# Patient Record
Sex: Male | Born: 1945 | Race: White | Hispanic: No | Marital: Married | Smoking: Former smoker
Health system: Southern US, Community
[De-identification: ages and names within clinical notes are randomized; demographics above are authoritative.]

## PROBLEM LIST (undated history)

## (undated) DIAGNOSIS — K759 Inflammatory liver disease, unspecified: Secondary | ICD-10-CM

## (undated) DIAGNOSIS — I10 Essential (primary) hypertension: Secondary | ICD-10-CM

## (undated) DIAGNOSIS — Z8711 Personal history of peptic ulcer disease: Secondary | ICD-10-CM

## (undated) DIAGNOSIS — I82409 Acute embolism and thrombosis of unspecified deep veins of unspecified lower extremity: Secondary | ICD-10-CM

## (undated) DIAGNOSIS — J449 Chronic obstructive pulmonary disease, unspecified: Secondary | ICD-10-CM

## (undated) DIAGNOSIS — I2699 Other pulmonary embolism without acute cor pulmonale: Secondary | ICD-10-CM

## (undated) DIAGNOSIS — J42 Unspecified chronic bronchitis: Secondary | ICD-10-CM

## (undated) DIAGNOSIS — R7611 Nonspecific reaction to tuberculin skin test without active tuberculosis: Secondary | ICD-10-CM

## (undated) DIAGNOSIS — D649 Anemia, unspecified: Secondary | ICD-10-CM

## (undated) DIAGNOSIS — G8929 Other chronic pain: Secondary | ICD-10-CM

## (undated) DIAGNOSIS — Z9289 Personal history of other medical treatment: Secondary | ICD-10-CM

## (undated) DIAGNOSIS — N281 Cyst of kidney, acquired: Secondary | ICD-10-CM

## (undated) DIAGNOSIS — M1990 Unspecified osteoarthritis, unspecified site: Secondary | ICD-10-CM

## (undated) DIAGNOSIS — Z9981 Dependence on supplemental oxygen: Secondary | ICD-10-CM

## (undated) DIAGNOSIS — M479 Spondylosis, unspecified: Secondary | ICD-10-CM

## (undated) DIAGNOSIS — M549 Dorsalgia, unspecified: Secondary | ICD-10-CM

## (undated) DIAGNOSIS — J45909 Unspecified asthma, uncomplicated: Secondary | ICD-10-CM

## (undated) DIAGNOSIS — Z8719 Personal history of other diseases of the digestive system: Secondary | ICD-10-CM

## (undated) HISTORY — PX: INGUINAL HERNIA REPAIR: SUR1180

## (undated) HISTORY — PX: KNEE CARTILAGE SURGERY: SHX688

## (undated) HISTORY — PX: HYDROCELE EXCISION: SHX482

## (undated) HISTORY — PX: NODE DISSECTION: SHX5269

## (undated) HISTORY — PX: APPENDECTOMY: SHX54

## (undated) HISTORY — PX: HEMORRHOID SURGERY: SHX153

---
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
## 2013-05-09 HISTORY — PX: SHOULDER ARTHROSCOPY W/ ROTATOR CUFF REPAIR: SHX2400

## 2014-06-17 ENCOUNTER — Inpatient Hospital Stay (HOSPITAL_COMMUNITY)
Admission: AD | Admit: 2014-06-17 | Discharge: 2014-06-22 | DRG: 176 | Disposition: A | Discharge status: Home / Self Care | Payer: Non-veteran care | Source: Other Acute Inpatient Hospital | Attending: Family Medicine | Admitting: Family Medicine

## 2014-06-17 DIAGNOSIS — I10 Essential (primary) hypertension: Secondary | ICD-10-CM | POA: Diagnosis present

## 2014-06-17 DIAGNOSIS — Z9981 Dependence on supplemental oxygen: Secondary | ICD-10-CM | POA: Diagnosis not present

## 2014-06-17 DIAGNOSIS — F1010 Alcohol abuse, uncomplicated: Secondary | ICD-10-CM | POA: Diagnosis present

## 2014-06-17 DIAGNOSIS — K219 Gastro-esophageal reflux disease without esophagitis: Secondary | ICD-10-CM | POA: Diagnosis present

## 2014-06-17 DIAGNOSIS — J42 Unspecified chronic bronchitis: Secondary | ICD-10-CM | POA: Diagnosis not present

## 2014-06-17 DIAGNOSIS — I2699 Other pulmonary embolism without acute cor pulmonale: Secondary | ICD-10-CM | POA: Diagnosis present

## 2014-06-17 DIAGNOSIS — I714 Abdominal aortic aneurysm, without rupture: Secondary | ICD-10-CM | POA: Diagnosis present

## 2014-06-17 DIAGNOSIS — N400 Enlarged prostate without lower urinary tract symptoms: Secondary | ICD-10-CM | POA: Diagnosis present

## 2014-06-17 DIAGNOSIS — J449 Chronic obstructive pulmonary disease, unspecified: Secondary | ICD-10-CM | POA: Diagnosis not present

## 2014-06-17 DIAGNOSIS — I82439 Acute embolism and thrombosis of unspecified popliteal vein: Secondary | ICD-10-CM | POA: Diagnosis not present

## 2014-06-17 DIAGNOSIS — Z87891 Personal history of nicotine dependence: Secondary | ICD-10-CM | POA: Diagnosis not present

## 2014-06-17 DIAGNOSIS — I82431 Acute embolism and thrombosis of right popliteal vein: Secondary | ICD-10-CM | POA: Diagnosis present

## 2014-06-17 DIAGNOSIS — R0602 Shortness of breath: Secondary | ICD-10-CM | POA: Diagnosis present

## 2014-06-17 HISTORY — DX: Unspecified chronic bronchitis: J42

## 2014-06-17 HISTORY — DX: Chronic obstructive pulmonary disease, unspecified: J44.9

## 2014-06-17 HISTORY — DX: Cyst of kidney, acquired: N28.1

## 2014-06-17 HISTORY — DX: Acute embolism and thrombosis of unspecified deep veins of unspecified lower extremity: I82.409

## 2014-06-17 HISTORY — DX: Nonspecific reaction to tuberculin skin test without active tuberculosis: R76.11

## 2014-06-17 HISTORY — DX: Other chronic pain: G89.29

## 2014-06-17 HISTORY — DX: Personal history of other diseases of the digestive system: Z87.19

## 2014-06-17 HISTORY — DX: Unspecified asthma, uncomplicated: J45.909

## 2014-06-17 HISTORY — DX: Essential (primary) hypertension: I10

## 2014-06-17 HISTORY — DX: Unspecified osteoarthritis, unspecified site: M19.90

## 2014-06-17 HISTORY — DX: Spondylosis, unspecified: M47.9

## 2014-06-17 HISTORY — DX: Dependence on supplemental oxygen: Z99.81

## 2014-06-17 HISTORY — DX: Anemia, unspecified: D64.9

## 2014-06-17 HISTORY — DX: Dorsalgia, unspecified: M54.9

## 2014-06-17 HISTORY — DX: Personal history of other medical treatment: Z92.89

## 2014-06-17 HISTORY — DX: Inflammatory liver disease, unspecified: K75.9

## 2014-06-17 HISTORY — DX: Personal history of peptic ulcer disease: Z87.11

## 2014-06-17 HISTORY — DX: Other pulmonary embolism without acute cor pulmonale: I26.99

## 2014-06-17 LAB — CBC
HEMATOCRIT: 47.5 % (ref 39.0–52.0)
HEMOGLOBIN: 15.9 g/dL (ref 13.0–17.0)
MCH: 31.7 pg (ref 26.0–34.0)
MCHC: 33.5 g/dL (ref 30.0–36.0)
MCV: 94.6 fL (ref 78.0–100.0)
Platelets: 193 10*3/uL (ref 150–400)
RBC: 5.02 MIL/uL (ref 4.22–5.81)
RDW: 12.3 % (ref 11.5–15.5)
WBC: 5.5 10*3/uL (ref 4.0–10.5)

## 2014-06-17 LAB — PROTIME-INR
INR: 1.23 (ref 0.00–1.49)
Prothrombin Time: 15.6 seconds — ABNORMAL HIGH (ref 11.6–15.2)

## 2014-06-17 LAB — MRSA PCR SCREENING: MRSA by PCR: NEGATIVE

## 2014-06-17 LAB — BRAIN NATRIURETIC PEPTIDE: B Natriuretic Peptide: 52 pg/mL (ref 0.0–100.0)

## 2014-06-17 LAB — GLUCOSE, CAPILLARY
GLUCOSE-CAPILLARY: 152 mg/dL — AB (ref 70–99)
Glucose-Capillary: 169 mg/dL — ABNORMAL HIGH (ref 70–99)

## 2014-06-17 LAB — TROPONIN I

## 2014-06-17 LAB — APTT: aPTT: >200 seconds (ref 24–37)

## 2014-06-17 LAB — LACTIC ACID, PLASMA: LACTIC ACID, VENOUS: 1.1 mmol/L (ref 0.5–2.0)

## 2014-06-17 MED ORDER — LORAZEPAM 2 MG/ML IJ SOLN: 1.0000 mg | INTRAMUSCULAR | Status: DC | PRN

## 2014-06-17 MED ORDER — VITAMIN B-1 100 MG PO TABS
100.0000 mg | ORAL_TABLET | Freq: Every day | ORAL | Status: DC
  Administered 2014-06-17 – 2014-06-22 (×5): 100 mg via ORAL
  Filled 2014-06-17 (×6): qty 1

## 2014-06-17 MED ORDER — DEXMEDETOMIDINE HCL IN NACL 200 MCG/50ML IV SOLN: 0.2000 ug/kg/h | INTRAVENOUS | Status: DC

## 2014-06-17 MED ORDER — SODIUM CHLORIDE 0.9 % IV SOLN
INTRAVENOUS | Status: DC
  Administered 2014-06-17: 20:00:00 via INTRAVENOUS

## 2014-06-17 MED ORDER — ADULT MULTIVITAMIN W/MINERALS CH
1.0000 | ORAL_TABLET | Freq: Every day | ORAL | Status: DC
  Administered 2014-06-17 – 2014-06-22 (×5): 1 via ORAL
  Filled 2014-06-17 (×6): qty 1

## 2014-06-17 MED ORDER — HEPARIN BOLUS VIA INFUSION
4000.0000 [IU] | Freq: Once | INTRAVENOUS | Status: AC
  Administered 2014-06-17: 4000 [IU] via INTRAVENOUS
  Filled 2014-06-17: qty 4000

## 2014-06-17 MED ORDER — IPRATROPIUM-ALBUTEROL 0.5-2.5 (3) MG/3ML IN SOLN
3.0000 mL | RESPIRATORY_TRACT | Status: DC
  Administered 2014-06-17 – 2014-06-18 (×3): 3 mL via RESPIRATORY_TRACT
  Filled 2014-06-17 (×3): qty 3

## 2014-06-17 MED ORDER — HEPARIN (PORCINE) IN NACL 100-0.45 UNIT/ML-% IJ SOLN
1550.0000 [IU]/h | INTRAMUSCULAR | Status: AC
  Administered 2014-06-18: 1300 [IU]/h via INTRAVENOUS
  Administered 2014-06-19 – 2014-06-20 (×2): 1500 [IU]/h via INTRAVENOUS
  Administered 2014-06-21: 1550 [IU]/h via INTRAVENOUS
  Administered 2014-06-21: 1500 [IU]/h via INTRAVENOUS
  Filled 2014-06-17 (×8): qty 250

## 2014-06-17 MED ORDER — FOLIC ACID 1 MG PO TABS
1.0000 mg | ORAL_TABLET | Freq: Every day | ORAL | Status: DC
  Administered 2014-06-17 – 2014-06-22 (×5): 1 mg via ORAL
  Filled 2014-06-17 (×6): qty 1

## 2014-06-17 MED ORDER — CHLORHEXIDINE GLUCONATE 0.12 % MT SOLN
15.0000 mL | Freq: Two times a day (BID) | OROMUCOSAL | Status: DC
  Administered 2014-06-18 – 2014-06-19 (×4): 15 mL via OROMUCOSAL
  Filled 2014-06-17 (×6): qty 15

## 2014-06-17 MED ORDER — CETYLPYRIDINIUM CHLORIDE 0.05 % MT LIQD
7.0000 mL | Freq: Two times a day (BID) | OROMUCOSAL | Status: DC
  Administered 2014-06-19 – 2014-06-20 (×4): 7 mL via OROMUCOSAL

## 2014-06-17 MED ORDER — PANTOPRAZOLE SODIUM 40 MG IV SOLR
40.0000 mg | Freq: Every day | INTRAVENOUS | Status: DC
  Administered 2014-06-17 – 2014-06-19 (×3): 40 mg via INTRAVENOUS
  Filled 2014-06-17 (×3): qty 40

## 2014-06-17 NOTE — H&P (Signed)
PULMONARY / CRITICAL CARE MEDICINE HISTORY AND PHYSICAL EXAMINATION   Name: Brian Clark MRN: [id number] DOB: February 11, 1946    ADMISSION DATE:  06/17/2014  PRIMARY SERVICE: PCCM  CHIEF COMPLAINT:  Chest pain, shortness of breath  BRIEF PATIENT DESCRIPTION: Mr. Brian Clark is a 69 yo male with PMHx of tobacco abuse, COPD on 2L [place] at home, GERD, BPH, and HTN who presented to [facility] hospital last night due to chest pain and shortness of breath. Patient was found to have a R popliteal vein DVT and extensive bilateral PE with moderate-large clot burden. Patient transferred to [facility] on heparin gtt for consideration of EKOS.  SIGNIFICANT EVENTS / STUDIES:  UA 4/8>> normal CXR 4/8>> hyperinflated lungs consistent with emphysema, but no acute cardiopulmonary findings R LE Doppler 4/8>> Positive for DVT in right popliteal vein, early re-cannulization of thrombus noted CT Angio Chest w Contrast 4/8>> bilateral PE involving RLL, RML, LUL. Clot burden moderate to large, increased RV to LV ratio at 1.5 with flattening of intraventricular septum compatible with increased right heart pressures/submassive PE. Positive for acute PE with CT evidence of RH strain at least submassive PE. Centrilobar emphysema-severe. Airway thickening indicative of bronchitis or reactive airway disease.   LINES / TUBES: R PIV x 2 4/8>>  CULTURES: None  ANTIBIOTICS: Ciprofloxacin for previous UTI completed 4/6 Rocephin started at [facility] on 4/8>D/C Azithromcyin started at [facility] on 4/8>>D/C  HISTORY OF PRESENT ILLNESS:  Mr. Brian Clark is a 69 yo male with PMHx of tobacco abuse, COPD on 2L [place] at home, GERD, BPH, and HTN who presented to [facility] hospital last night due to chest pain and shortness of breath. Patient states over the past 2-3 weeks he has felt feverish with increased shortness of breath and a productive cough of clear sputum. He is normally on 2 L of O2 during the day, 4 L with exertion, at 3 L at night. Lately he  has been requiring at least 3 L. Of note patient states he was diagnosed with a UTI with hematuria 2 weeks ago and completed a 2 week course of ciprofloxacin 2 days ago. Last night at 0130, patient awoke from sleep with severe left sided chest pain at a 5/10, but increased to a 7/10. Pain was located on the left side of his sternum with radiation inferiorly to his left diaphragm. Pain was worse with deep inspiration. Nothing seemed to make the pain better but time. Onset of pain was associated with shortness of breath, but he denied diaphoresis, dizziness, lightheadedness, nausea, or vomiting. Of note, patient does state he has had pain in his right lower extremity and ankle edema for the past 2 weeks. He denies any calf tenderness. Patient has a 120 pack year history and admits to decreased mobility at home. He normally sits most of the day. No prior history of heart disease, CAD, DVT or PE. Well's score of 9. Patient presented to [facility] and was found to have a R popliteal vein DVT and extensive bilateral PE with moderate-large clot burden. Patient transferred to [facility] on heparin gtt for consideration of EKOS.  PAST MEDICAL HISTORY : Tobacco abuse 3-4 ppd for 47 years, quit in 2000 HTN BPH COPD on home O2 GERD  PAST SURGICAL HISTORY: Appendectomy Rotator Cuff Surgery in March 2015  Prior to Admission medications   Not on File  Albuterol, amlodipine 2.5 mg QD, ASA 81 mg QD, Finasteride 5 mg daily, Singulair, lisinopril 40 mg daily, omeprazole 20 mg daily, tamsulosin 0.4 mg  QD, spiriva.   No Known Allergies  FAMILY HISTORY:  Mother, Father- CVA  SOCIAL HISTORY: Tobacco abuse 3-4 ppd for 47 years, quit in 2000 Alcohol abuse 8-10 beers a day No illicit drug use Married  REVIEW OF SYSTEMS:   General: Admits to fever and chills, fatigue. Denies change in appetite and diaphoresis.  Respiratory: Admits to SOB, productive cough, DOE, chest tightness, and wheezing.    Cardiovascular: Admits to chest pain, denies palpitations.  Gastrointestinal: Denies nausea, vomiting, abdominal pain, diarrhea, constipation Genitourinary: Admits to hematuria (resolved). Denies dysuria, urgency, frequency, suprapubic pain and flank pain.  Skin: Denies pallor, rash and wounds.  Neurological: Denies dizziness, headaches, weakness, lightheadedness   PHYSICAL EXAMINATION: Filed Vitals:   06/17/14 1830 06/17/14 1840 06/17/14 1845 06/17/14 1900  BP: 139/81  138/83 138/93  Pulse: 114 109 106 105  Resp: 19 19 21 22   Height:  6' (1.829 m)    Weight:  190 lb 0.6 oz (86.2 kg)    SpO2: 94% 96% 95% 94%   General: Vital signs reviewed.  Patient is well-developed and well-nourished, in no acute distress and cooperative with exam.  Head: Normocephalic and atraumatic. Eyes: EOMI, conjunctivae normal, no scleral icterus.  Neck: No JVD or carotid bruit present.  Cardiovascular: Tachycardic, regular rhythm, S1 normal, S2 normal, no murmurs, gallops, or rubs. Pulmonary/Chest: Diffuse mild expiratory rhonchi and wheezes. No rales.  Abdominal: Soft, non-tender, non-distended, BS + Extremities: 1-2 + pitting edema in right lower ankle, no calf tenderness, pulses symmetric and intact bilaterally. Negative Homan's sign. No cyanosis or clubbing. Neurological: A&O x3 Skin: Extensive seborrheic keratosis on back. Psychiatric: Normal mood and affect. speech and behavior is normal. Cognition and memory are normal.    LABS:  CBC No results for input(s): WBC, HGB, HCT, PLT in the last 168 hours. Coag's No results for input(s): APTT, INR in the last 168 hours. BMET No results for input(s): NA, K, CL, CO2, BUN, CREATININE, GLUCOSE in the last 168 hours. Electrolytes No results for input(s): CALCIUM, MG, PHOS in the last 168 hours. Sepsis Markers No results for input(s): LATICACIDVEN, PROCALCITON, O2SATVEN in the last 168 hours. ABG No results for input(s): PHART, PCO2ART, PO2ART in the  last 168 hours. Liver Enzymes No results for input(s): AST, ALT, ALKPHOS, BILITOT, ALBUMIN in the last 168 hours. Cardiac Enzymes No results for input(s): TROPONINI, PROBNP in the last 168 hours. Glucose  Recent Labs Lab 06/17/14 1832  GLUCAP 169*   ASSESSMENT / PLAN:  Active Problems:   Pulmonary embolism  PULMONARY  ASSESSMENT: Acute bilateral PEs Increased O2 requirement  COPD without exacerbation Wheezing PLAN:  Heparin drip as noted below [facility] will hold for now Holding abx Duonebs q4h  CXR  CARDIOVASCULAR  ASSESSMENT:  Pleuritic left sided chest pain which awoke the patient from sleep and was worse with deep inspiration Troponin negative x 2 at [place] HTN, controlled- on Amlodipine 2.5mg  and Lisinopril 40g daily at home H/o of AAA. Report from [facility] is that pt with h/o rupture, which pt denies. AAA stable at 3.5 cm per pt. Imaging every December with PCP. PLAN:  Continue to trend troponin Q6H x3 Will hold home BP meds in the setting of acute PE and resume as needed EKG Echo  RENAL/GU  ASSESSMENT:  H/o BPH w/o previous episode of hematuria, that has resolved after 2 weeks of abx UA normal at [facility]  PLAN:  Resuming home finasteride and tamsulosin Repeat BMET tomorrow am  GASTROINTESTINAL  ASSESSMENT:  GERD PLAN:  NPO for now Protonix 40 mg QD  HEMATOLOGIC  ASSESSMENT:  Acute R popliteal vein DVT Acute bilateral PE of the right lobe, right middle lobe, right lower lobe, and left upper lobe.  PTT 25 at OSH PLAN:  Heparin drip started at [place] prior to transfer, which was continued on admission. Rechecking PTT, PT, INR Will discuss EKOS with IR CBC tomorrow am  INFECTIOUS  ASSESSMENT:  Recent UTI treated with 2 weeks of Cipro. Course completed approx 2 days ago. No current hematuria or dysuria. No leukocytosis, VSS. UA at [facility] prior to transfer normal.   PLAN:  No further intervention at this time.  Repeat CBC tomorrow am  ENDOCRINE  ASSESSMENT:  No h/o diabetes. CBG 169 on admission. Steroids given at [facility].  PLAN:  Will continue to monitor and begin SSI as needed.   NEUROLOGIC  ASSESSMENT:  Pt's alert and oriented x3. No focal deficits.  PLAN:  None at this time.   CLINICAL SUMMARY: 69yo M w/ PMH HTN, COPD, approx 120 yr pack history, EtOH abuse, AAA, GERD, who was recently diagnosed with R popliteal vein and scattered bilateral PEs, presents for possible thrombolytic therapy vs anticoagulation. Code status had with patient and family and would like to be FULL CODE.  Jill Alexanders, DO PGY-1 Internal Medicine Resident Pager # [phone] 06/17/2014 8:54 PM   I have personally obtained a history, examined the patient, evaluated laboratory and imaging results, formulated the assessment and plan and placed orders. CRITICAL CARE: The patient is critically ill with multiple organ systems failure and requires high complexity decision making for assessment and support, frequent evaluation and titration of therapies, application of advanced monitoring technologies and extensive interpretation of multiple databases. Critical Care Time devoted to patient care services described in this note is 45 minutes.    Pulmonary and Critical Care Medicine [facility] Pager: [phone]  06/17/2014, 7:34 PM

## 2014-06-17 NOTE — Progress Notes (Signed)
CRITICAL VALUE ALERT  Critical value received:  APTT >200  Date of notification:  06/17/2014  Time of notification:  2300  Critical value read back:Yes.    Nurse who received alert:  Burtis JunesA Amjad Fikes   MD notified (1st page):  Sherrine MaplesGlenn  Time of first page:  2313  MD notified (2nd page):  Time of second page:  Responding MD:  Sherrine MaplesGlenn  Time MD responded:  [phone]

## 2014-06-17 NOTE — Progress Notes (Signed)
eLink Physician-Brief Progress Note Patient Name: Fernanda DrumHarry Voris DOB: [birth year] MRN: [id number]   Date of Service  06/17/2014  HPI/Events of Note  Pt admitted with acute PE and DVT RLE. Poss EKOS patient  eICU Interventions  Heparin drip Poss EKOS.  chk echo, trop bnp  See adm orders Full h and P to follow     Intervention Category Evaluation Type: New Patient Evaluation  Shan Levansatrick Joncarlos Atkison 06/17/2014, 6:50 PM

## 2014-06-17 NOTE — Progress Notes (Addendum)
ANTICOAGULATION CONSULT NOTE - Initial Consult  Pharmacy Consult for heparin   Indication: pulmonary embolus  No Known Allergies  Patient Measurements: Height: 6' (182.9 cm) Weight: 190 lb 0.6 oz (86.2 kg) IBW/kg (Calculated) : 77.6 Heparin Dosing Weight: 86 kg  Vital Signs: BP: 138/93 mmHg (04/08 1900) Pulse Rate: 105 (04/08 1900)  Labs: No results for input(s): HGB, HCT, PLT, APTT, LABPROT, INR, HEPARINUNFRC, CREATININE, CKTOTAL, CKMB, TROPONINI in the last 72 hours.  CrCl cannot be calculated (Patient has no serum creatinine result on file.).   Medical History: No past medical history on file.  Medications:  Scheduled:  . pantoprazole (PROTONIX) IV  40 mg Intravenous QHS   Infusions:  . sodium chloride      Assessment: 69 yo who was dx with PE and DVT and was [facility]. Heparin has been ordered to be continue for anticoagulation. Plan is also possible for EKOS. He received a bolus and started a drip [facility] around 1550. Another bolus was given here ~2000.   Goal of Therapy:  Heparin level 0.3-0.7 units/ml Monitor platelets by anticoagulation protocol: Yes   Plan:   Heparin drip at 1400 units/hr F/u with 6 hr heparin level Daily level and CBC  Ulyses SouthwardMinh Pham, PharmD Pager: [phone] 06/17/2014 7:35 PM

## 2014-06-18 ENCOUNTER — Inpatient Hospital Stay (HOSPITAL_COMMUNITY): Payer: Non-veteran care

## 2014-06-18 DIAGNOSIS — I82439 Acute embolism and thrombosis of unspecified popliteal vein: Secondary | ICD-10-CM | POA: Diagnosis not present

## 2014-06-18 DIAGNOSIS — I2699 Other pulmonary embolism without acute cor pulmonale: Secondary | ICD-10-CM | POA: Diagnosis not present

## 2014-06-18 LAB — CBC
HCT: 44.3 % (ref 39.0–52.0)
HEMATOCRIT: 45.6 % (ref 39.0–52.0)
HEMOGLOBIN: 15 g/dL (ref 13.0–17.0)
Hemoglobin: 15.1 g/dL (ref 13.0–17.0)
MCH: 31.3 pg (ref 26.0–34.0)
MCH: 31.8 pg (ref 26.0–34.0)
MCHC: 33.1 g/dL (ref 30.0–36.0)
MCHC: 33.9 g/dL (ref 30.0–36.0)
MCV: 94.1 fL (ref 78.0–100.0)
MCV: 94.6 fL (ref 78.0–100.0)
Platelets: 193 10*3/uL (ref 150–400)
Platelets: 192 10*3/uL (ref 150–400)
RBC: 4.82 MIL/uL (ref 4.22–5.81)
RBC: 4.71 MIL/uL (ref 4.22–5.81)
RDW: 12.3 % (ref 11.5–15.5)
RDW: 12.3 % (ref 11.5–15.5)
WBC: 8.5 10*3/uL (ref 4.0–10.5)
WBC: 5.8 10*3/uL (ref 4.0–10.5)

## 2014-06-18 LAB — BASIC METABOLIC PANEL
Anion gap: 14 (ref 5–15)
BUN: 8 mg/dL (ref 6–23)
CO2: 21 mmol/L (ref 19–32)
CREATININE: 0.71 mg/dL (ref 0.50–1.35)
Calcium: 8.8 mg/dL (ref 8.4–10.5)
Chloride: 99 mmol/L (ref 96–112)
GFR calc Af Amer: >90 mL/min (ref >90)
GFR calc non Af Amer: >90 mL/min (ref >90)
GLUCOSE: 167 mg/dL — AB (ref 70–99)
POTASSIUM: 4 mmol/L (ref 3.5–5.1)
Sodium: 134 mmol/L — ABNORMAL LOW (ref 135–145)

## 2014-06-18 LAB — GLUCOSE, CAPILLARY
Glucose-Capillary: 159 mg/dL — ABNORMAL HIGH (ref 70–99)
Glucose-Capillary: 181 mg/dL — ABNORMAL HIGH (ref 70–99)

## 2014-06-18 LAB — TROPONIN I: Troponin I: <0.03 ng/mL (ref <0.031)

## 2014-06-18 LAB — HEPARIN LEVEL (UNFRACTIONATED)
HEPARIN UNFRACTIONATED: 0.42 [IU]/mL (ref 0.30–0.70)
HEPARIN UNFRACTIONATED: 0.14 [IU]/mL — AB (ref 0.30–0.70)
HEPARIN UNFRACTIONATED: 0.83 [IU]/mL — AB (ref 0.30–0.70)

## 2014-06-18 LAB — LACTIC ACID, PLASMA: Lactic Acid, Venous: 0.9 mmol/L (ref 0.5–2.0)

## 2014-06-18 MED ORDER — NICOTINE POLACRILEX 2 MG MT GUM
2.0000 mg | CHEWING_GUM | OROMUCOSAL | Status: DC | PRN
  Administered 2014-06-19 (×2): 2 mg via ORAL
  Filled 2014-06-18 (×4): qty 1

## 2014-06-18 MED ORDER — IPRATROPIUM-ALBUTEROL 0.5-2.5 (3) MG/3ML IN SOLN
3.0000 mL | Freq: Four times a day (QID) | RESPIRATORY_TRACT | Status: DC
  Administered 2014-06-18 – 2014-06-22 (×15): 3 mL via RESPIRATORY_TRACT
  Filled 2014-06-18 (×16): qty 3

## 2014-06-18 MED ORDER — HEPARIN BOLUS VIA INFUSION
2000.0000 [IU] | Freq: Once | INTRAVENOUS | Status: AC
  Administered 2014-06-18: 2000 [IU] via INTRAVENOUS
  Filled 2014-06-18: qty 2000

## 2014-06-18 MED ORDER — MENTHOL 3 MG MT LOZG
1.0000 | LOZENGE | OROMUCOSAL | Status: DC | PRN
  Administered 2014-06-19: 3 mg via ORAL
  Filled 2014-06-18: qty 9

## 2014-06-18 NOTE — Consult Note (Signed)
Chief Complaint: No chief complaint on file.   Referring Physician(s): * No referring provider recorded for this case *  History of Present Illness: Brian Clark is a 69 y.o. male with submassive PE admitted to the ICU. He has had increasing home O2 requirements over the last couple of weeks (normally, he is on 2L during the day and 3L) at night). He also had the recent onset of R ankle paine. Severe CP developed early yesterday morning. He underwent CTA chest and dopplers yesterday at [facility], both positive. Doppler showed only pop vein DVT. CTA shows right heart strain (RV/LV 1.4). He is asymptomatic in bed and has not ambulated yet.   No past medical history on file.  No past surgical history on file.  Allergies: Review of patient's allergies indicates no known allergies.  Medications: Prior to Admission medications   Not on File     No family history on file.  History   Social History  . Marital Status: Married    Spouse Name: N/A  . Number of Children: N/A  . Years of Education: N/A   Social History Main Topics  . Smoking status: Not on file  . Smokeless tobacco: Not on file  . Alcohol Use: Not on file  . Drug Use: Not on file  . Sexual Activity: Not on file   Other Topics Concern  . Not on file   Social History Narrative  . No narrative on file      Review of Systems: A 12 point ROS discussed and pertinent positives are indicated in the HPI above.  All other systems are negative.  Review of Systems  Vital Signs: BP 103/62 mmHg  Pulse 82  Temp(Src) 98.3 F (36.8 C) (Oral)  Resp 22  Ht 6' (1.829 m)  Wt 191 lb 12.8 oz (87 kg)  BMI 26.01 kg/m2  SpO2 94%  Physical Exam  Mallampati Score:     Imaging: Dg Chest Port 1 View  06/18/2014   CLINICAL DATA:  Pulmonary embolism.  EXAM: PORTABLE CHEST - 1 VIEW  COMPARISON:  06/17/2014  FINDINGS: Lungs are adequately inflated without consolidation or effusion. Mild emphysematous disease.  Cardiomediastinal silhouette and remainder of the exam is unchanged.  IMPRESSION: No active disease.   Electronically Signed   By: Elberta Fortisaniel  Boyle M.D.   On: 06/18/2014 07:41    Labs:  CBC:  Recent Labs  06/17/14 2050 06/18/14 0059  WBC 5.5 5.8  HGB 15.9 15.1  HCT 47.5 45.6  PLT 193 193    COAGS:  Recent Labs  06/17/14 2050  INR 1.23  APTT >200*    BMP:  Recent Labs  06/18/14 0059  NA 134*  K 4.0  CL 99  CO2 21  GLUCOSE 167*  BUN 8  CALCIUM 8.8  CREATININE 0.71  GFRNONAA >90  GFRAA >90    LIVER FUNCTION TESTS: No results for input(s): BILITOT, AST, ALT, ALKPHOS, PROT, ALBUMIN in the last 8760 hours.  TUMOR MARKERS: No results for input(s): AFPTM, CEA, CA199, CHROMGRNA in the last 8760 hours.  Assessment and Plan:  Submassive PE and right heart strain. He will benefit from EKOS PE lysis. We will proceed once unit is available.  Thank you for this interesting consult.  I greatly enjoyed meeting Brian DrumHarry Fung and look forward to participating in their care.  Signed: Jarita Raval, ART A 06/18/2014, 9:23 AM   I spent a total of 40 Minutes  in face to face in clinical consultation,  greater than 50% of which was counseling/coordinating care for PE lysis.

## 2014-06-18 NOTE — Progress Notes (Signed)
eLink Physician-Brief Progress Note Patient Name: Brian DrumHarry Clark DOB: 1945-03-26 MRN: [id number]   Date of Service  06/18/2014  HPI/Events of Note  Sore throat Smoking cessation  eICU Interventions  Cepacol prn Nicorette Gum prn     Intervention Category Minor Interventions: Routine modifications to care plan (e.g. PRN medications for pain, fever)  Brian Clark 06/18/2014, 11:41 PM

## 2014-06-18 NOTE — Progress Notes (Addendum)
PULMONARY / CRITICAL CARE MEDICINE HISTORY AND PHYSICAL EXAMINATION   Name: Brian Clark MRN: [id number] DOB: 17-Aug-1945    ADMISSION DATE:  06/17/2014  PRIMARY SERVICE: PCCM  CHIEF COMPLAINT:  Chest pain, shortness of breath  BRIEF PATIENT DESCRIPTION: Brian Clark is a 69 yo male with PMHx of tobacco abuse, COPD on 2L [place] at home, GERD, BPH, and HTN who [facility] due to chest pain and shortness of breath. Patient was found to have a R popliteal vein DVT and extensive bilateral PE with moderate-large clot burden. [facility] on heparin gtt for consideration of EKOS.  SIGNIFICANT EVENTS / STUDIES:  R LE Doppler 4/8>> Positive for DVT in right popliteal vein, early re-cannulization of thrombus noted CT Angio Chest w Contrast 4/8>> bilateral PE involving RLL, RML, LUL. Clot burden moderate to large, increased RV to LV ratio at 1.5 with flattening of intraventricular septum compatible with increased right heart pressures/submassive PE. Positive for acute PE with CT evidence of RH strain at least submassive PE. Centrilobar emphysema-severe. Airway thickening indicative of bronchitis or reactive airway disease.   SUBJECTIVE:   PHYSICAL EXAMINATION: Temp:  [97.5 F (36.4 C)-98.4 F (36.9 C)] 98.3 F (36.8 C) (04/09 0754) Pulse Rate:  [74-117] 95 (04/09 0900) Resp:  [16-34] 19 (04/09 0800) BP: (103-171)/(62-103) 115/74 mmHg (04/09 0900) SpO2:  [92 %-96 %] 93 % (04/09 0900) Weight:  [190 lb 0.6 oz (86.2 kg)-191 lb 12.8 oz (87 kg)] 191 lb 12.8 oz (87 kg) (04/09 0500)  General: pleasant HEENT: decreased hearing Cardiac: regular, tachycarcdic Chest: no wheeze Abd: soft, non tender Ext: no edema Neuro: normal strength  LABS:  CBC  Recent Labs Lab 06/17/14 2050 06/18/14 0059  WBC 5.5 5.8  HGB 15.9 15.1  HCT 47.5 45.6  PLT 193 193   Coag's  Recent Labs Lab 06/17/14 2050  APTT >200*  INR 1.23   BMET  Recent Labs Lab  06/18/14 0059  NA 134*  K 4.0  CL 99  CO2 21  BUN 8  CREATININE 0.71  GLUCOSE 167*   Electrolytes  Recent Labs Lab 06/18/14 0059  CALCIUM 8.8   Sepsis Markers  Recent Labs Lab 06/17/14 2201 06/18/14 0059  LATICACIDVEN 1.1 0.9   Cardiac Enzymes  Recent Labs Lab 06/17/14 2050 06/18/14 0059  TROPONINI <0.03 <0.03   Glucose  Recent Labs Lab 06/17/14 1832 06/17/14 2151 06/18/14 0013 06/18/14 0417  GLUCAP 169* 152* 159* 181*    Dg Chest Port 1 View  06/18/2014   CLINICAL DATA:  Pulmonary embolism.  EXAM: PORTABLE CHEST - 1 VIEW  COMPARISON:  06/17/2014  FINDINGS: Lungs are adequately inflated without consolidation or effusion. Mild emphysematous disease. Cardiomediastinal silhouette and remainder of the exam is unchanged.  IMPRESSION: No active disease.   Electronically Signed   By: Elberta Fortisaniel  Boyle M.D.   On: 06/18/2014 07:41    ASSESSMENT / PLAN:   Acute submassive PE and Rt popliteal DVT. Plan: - heparin gtt - EKOS per IR - oxygen to keep SpO2 > 92% - f/u Echo  COPD w/o exacerbation. Plan: - scheduled BD's  Hx of HTN. Plan: - monitor hemodynamics  Hx of BPH. Plan: - monitor urine outpt  Hx of GERD. Plan: - protonix  D/w Dr. Bonnielee HaffHoss.  Coralyn HellingVineet Marsh Heckler, MD [facility] Pulmonary/Critical Care 06/18/2014, 9:44 AM Pager:  [phone] After 3pm call: [phone]

## 2014-06-18 NOTE — Progress Notes (Signed)
ANTICOAGULATION CONSULT NOTE - Follow Up Consult  Pharmacy Consult for heparin Indication: pulmonary embolus  Labs:  Recent Labs  06/17/14 2050 06/18/14 0059  HGB 15.9 15.1  HCT 47.5 45.6  PLT 193 193  APTT >200*  --   LABPROT 15.6*  --   INR 1.23  --   HEPARINUNFRC  --  0.83*  CREATININE  --  0.71  TROPONINI <0.03 <0.03    Assessment: 69yo male supratherapeutic on heparin with initial dosing for PE; pt did receive two boluses.  Goal of Therapy:  Heparin level 0.3-0.7 units/ml   Plan:  Will decrease heparin gtt slightly to 1300 units/hr and check level in 6hr.  Vernard GamblesVeronda Yogi Arther, PharmD, BCPS  06/18/2014,2:23 AM

## 2014-06-18 NOTE — Progress Notes (Addendum)
ANTICOAGULATION CONSULT NOTE - Follow Up Consult  Pharmacy Consult for Heparin  Indication: pulmonary embolus  No Known Allergies  Patient Measurements: Height: 6' (182.9 cm) Weight: 191 lb 12.8 oz (87 kg) IBW/kg (Calculated) : 77.6 Heparin Dosing Weight: 87 kg  Vital Signs: Temp: 98.3 F (36.8 C) (04/09 0754) Temp Source: Oral (04/09 0754) BP: 115/74 mmHg (04/09 0900) Pulse Rate: 95 (04/09 0900)  Labs:  Recent Labs  06/17/14 2050 06/18/14 0059 06/18/14 0951  HGB 15.9 15.1 15.0  HCT 47.5 45.6 44.3  PLT 193 193 192  APTT >200*  --   --   LABPROT 15.6*  --   --   INR 1.23  --   --   HEPARINUNFRC  --  0.83* 0.42  CREATININE  --  0.71  --   TROPONINI <0.03 <0.03 <0.03    Estimated Creatinine Clearance: 98.3 mL/min (by C-G formula based on Cr of 0.71).   Medications:  Heparin @ 1300 units/hr (13 ml/hr)  Assessment: 68 YOM who continues on heparin for a new acute submassive PE and popliteal DVT. Per CCM note, still planning EKOS. Heparin level this morning is therapeutic (HL 0.42, goal of 0.3-0.7). CBC stable - no overt s/sx of bleeding noted. Will follow-up with a 8 hour HL to confirm vs post-IR if the patient undergoes EKOS.   Goal of Therapy:  Heparin level 0.3-0.7 units/ml Monitor platelets by anticoagulation protocol: Yes   Plan:  1. Continue Heparin at 1300 units/hr (13 ml/hr) 2. Will continue to monitor for any signs/symptoms of bleeding and will follow up with heparin level in 6 hours vs post-IR if goes for EKOS  Georgina PillionElizabeth Montague Corella, PharmD, BCPS Clinical Pharmacist Pager: [phone] 06/18/2014 11:27 AM    ---------------------------------------------------------------------------------------------------------------- Addendum:   Heparin level this evening resulted as SUBtherapeutic (HL 0.14 << 0.42, goal of 0.3-0.7). Per discussion with the RN - the drip was turned off for ~20-30 minutes earlier this afternoon while the IV site was being changed around 1300.  This may have contributed to some of the HL being low but not all. Will increase conservatively and recheck a HL in 6 hours. No overt s/sx of bleeding noted  Plan 1. Heparin 2000 unit bolus x 1  2. Increase Heparin to 1500 units/hr (15 ml/hr) 3. Will continue to monitor for any signs/symptoms of bleeding and will follow up with heparin level in 6 hours   Georgina PillionElizabeth Ludell Zacarias, PharmD, BCPS Clinical Pharmacist Pager: [phone] 06/18/2014 6:32 PM

## 2014-06-19 ENCOUNTER — Inpatient Hospital Stay (HOSPITAL_COMMUNITY): Payer: Non-veteran care

## 2014-06-19 DIAGNOSIS — I82409 Acute embolism and thrombosis of unspecified deep veins of unspecified lower extremity: Secondary | ICD-10-CM

## 2014-06-19 DIAGNOSIS — I2699 Other pulmonary embolism without acute cor pulmonale: Secondary | ICD-10-CM | POA: Diagnosis not present

## 2014-06-19 DIAGNOSIS — J449 Chronic obstructive pulmonary disease, unspecified: Secondary | ICD-10-CM | POA: Diagnosis not present

## 2014-06-19 HISTORY — DX: Acute embolism and thrombosis of unspecified deep veins of unspecified lower extremity: I82.409

## 2014-06-19 LAB — BASIC METABOLIC PANEL
Anion gap: 6 (ref 5–15)
BUN: 13 mg/dL (ref 6–23)
CO2: 29 mmol/L (ref 19–32)
Calcium: 8.5 mg/dL (ref 8.4–10.5)
Chloride: 102 mmol/L (ref 96–112)
Creatinine, Ser: 0.86 mg/dL (ref 0.50–1.35)
GFR calc non Af Amer: 88 mL/min — ABNORMAL LOW (ref >90)
GLUCOSE: 108 mg/dL — AB (ref 70–99)
Potassium: 4 mmol/L (ref 3.5–5.1)
Sodium: 137 mmol/L (ref 135–145)

## 2014-06-19 LAB — CBC
HCT: 42.8 % (ref 39.0–52.0)
Hemoglobin: 14.3 g/dL (ref 13.0–17.0)
MCH: 31.8 pg (ref 26.0–34.0)
MCHC: 33.4 g/dL (ref 30.0–36.0)
MCV: 95.3 fL (ref 78.0–100.0)
PLATELETS: 168 10*3/uL (ref 150–400)
RBC: 4.49 MIL/uL (ref 4.22–5.81)
RDW: 12.5 % (ref 11.5–15.5)
WBC: 9.1 10*3/uL (ref 4.0–10.5)

## 2014-06-19 LAB — HEPARIN LEVEL (UNFRACTIONATED)
HEPARIN UNFRACTIONATED: 0.41 [IU]/mL (ref 0.30–0.70)
HEPARIN UNFRACTIONATED: 0.58 [IU]/mL (ref 0.30–0.70)
Heparin Unfractionated: 0.42 IU/mL (ref 0.30–0.70)

## 2014-06-19 MED ORDER — SODIUM CHLORIDE 0.9 % IJ SOLN
3.0000 mL | Freq: Two times a day (BID) | INTRAMUSCULAR | Status: DC
  Administered 2014-06-19: 3 mL via INTRAVENOUS

## 2014-06-19 MED ORDER — MIDAZOLAM HCL 2 MG/2ML IJ SOLN
INTRAMUSCULAR | Status: AC
  Filled 2014-06-19: qty 2

## 2014-06-19 MED ORDER — SODIUM CHLORIDE 0.9 % IV SOLN
INTRAVENOUS | Status: AC | PRN
  Administered 2014-06-19: 10 mL/h via INTRAVENOUS

## 2014-06-19 MED ORDER — SODIUM CHLORIDE 0.9 % IV SOLN: 250.0000 mL | INTRAVENOUS | Status: DC | PRN

## 2014-06-19 MED ORDER — GUAIFENESIN ER 600 MG PO TB12
1200.0000 mg | ORAL_TABLET | Freq: Two times a day (BID) | ORAL | Status: DC | PRN
  Administered 2014-06-19 – 2014-06-21 (×4): 1200 mg via ORAL
  Filled 2014-06-19 (×7): qty 2

## 2014-06-19 MED ORDER — AMLODIPINE BESYLATE 5 MG PO TABS
5.0000 mg | ORAL_TABLET | Freq: Every day | ORAL | Status: DC
  Administered 2014-06-19 – 2014-06-22 (×4): 5 mg via ORAL
  Filled 2014-06-19 (×4): qty 1

## 2014-06-19 MED ORDER — SODIUM CHLORIDE 0.9 % IV SOLN
12.0000 mg | Freq: Once | INTRAVENOUS | Status: DC
  Filled 2014-06-19: qty 12

## 2014-06-19 MED ORDER — FENTANYL CITRATE 0.05 MG/ML IJ SOLN
INTRAMUSCULAR | Status: AC | PRN
  Administered 2014-06-19: 50 ug via INTRAVENOUS

## 2014-06-19 MED ORDER — SODIUM CHLORIDE 0.9 % IV SOLN
INTRAVENOUS | Status: DC
  Administered 2014-06-19: 22:00:00 via INTRAVENOUS

## 2014-06-19 MED ORDER — MIDAZOLAM HCL 2 MG/2ML IJ SOLN
INTRAMUSCULAR | Status: AC | PRN
Start: 2014-06-19 — End: 2014-06-19
  Administered 2014-06-19: 1 mg via INTRAVENOUS

## 2014-06-19 MED ORDER — LIDOCAINE HCL 1 % IJ SOLN
INTRAMUSCULAR | Status: AC
  Filled 2014-06-19: qty 20

## 2014-06-19 MED ORDER — FENTANYL CITRATE 0.05 MG/ML IJ SOLN
INTRAMUSCULAR | Status: AC
  Filled 2014-06-19: qty 2

## 2014-06-19 MED ORDER — SODIUM CHLORIDE 0.9 % IV SOLN: INTRAVENOUS | Status: DC

## 2014-06-19 MED ORDER — SALINE SPRAY 0.65 % NA SOLN
1.0000 | NASAL | Status: DC | PRN
  Administered 2014-06-19: 1 via NASAL
  Filled 2014-06-19: qty 44

## 2014-06-19 MED ORDER — SODIUM CHLORIDE 0.9 % IJ SOLN: 3.0000 mL | INTRAMUSCULAR | Status: DC | PRN

## 2014-06-19 MED ORDER — PHENOL 1.4 % MT LIQD
1.0000 | OROMUCOSAL | Status: DC | PRN
  Filled 2014-06-19: qty 177

## 2014-06-19 MED ORDER — IOHEXOL 300 MG/ML  SOLN
50.0000 mL | Freq: Once | INTRAMUSCULAR | Status: AC | PRN
  Administered 2014-06-19: 10 mL via INTRAVENOUS

## 2014-06-19 NOTE — Procedures (Signed)
B PA gram and B PA pressure measurements  PA pressure 35/10 (22). Mild clot burden.  PA lysis not performed  Comp - none  Rec - Cont heparin. [facility]

## 2014-06-19 NOTE — Progress Notes (Signed)
ANTICOAGULATION CONSULT NOTE - Follow Up Consult  Pharmacy Consult for heparin Indication: pulmonary embolus   Labs:  Recent Labs  06/17/14 2050  06/18/14 0059 06/18/14 0951 06/18/14 1700 06/19/14 0035  HGB 15.9  --  15.1 15.0  --  14.3  HCT 47.5  --  45.6 44.3  --  42.8  PLT 193  --  193 192  --  168  APTT >200*  --   --   --   --   --   LABPROT 15.6*  --   --   --   --   --   INR 1.23  --   --   --   --   --   HEPARINUNFRC  --   < > 0.83* 0.42 0.14* 0.58  CREATININE  --   --  0.71  --   --   --   TROPONINI <0.03  --  <0.03 <0.03  --   --   < > = values in this interval not displayed.    Assessment/Plan:  69yo male therapeutic on heparin after rate increase. Will continue gtt at current rate and confirm stable with additional level.   Vernard GamblesVeronda Mishawn Hemann, PharmD, BCPS  06/19/2014,1:37 AM

## 2014-06-19 NOTE — Progress Notes (Signed)
PULMONARY / CRITICAL CARE MEDICINE  PROGRESS NOTE   Name: Brian Clark MRN: [id number] DOB: 26-May-1945    ADMISSION DATE:  06/17/2014  PRIMARY SERVICE: PCCM  CHIEF COMPLAINT:  Chest pain, shortness of breath  BRIEF PATIENT DESCRIPTION: Brian Clark is a 69 yo male with PMHx of tobacco abuse, COPD on 2L [place] at home, GERD, BPH, and HTN who presented to [facility] hospital last night due to chest pain and shortness of breath. Patient was found to have a R popliteal vein DVT and extensive bilateral PE with moderate-large clot burden. Patient transferred to [facility] on heparin gtt for consideration of EKOS.  SIGNIFICANT EVENTS / STUDIES:  R LE Doppler 4/8>> Positive for DVT in right popliteal vein, early re-cannulization of thrombus noted CT Angio Chest w Contrast 4/8>> bilateral PE involving RLL, RML, LUL. Clot burden moderate to large, increased RV to LV ratio at 1.5 with flattening of intraventricular septum compatible with increased right heart pressures/submassive PE. Positive for acute PE with CT evidence of RH strain at least submassive PE. Centrilobar emphysema-severe. Airway thickening indicative of bronchitis or reactive airway disease.   SUBJECTIVE: No increased SOB.  Afebrile, no tachycardia, no chest pain  PHYSICAL EXAMINATION: Temp:  [97.5 F (36.4 C)-98.7 F (37.1 C)] 98.7 F (37.1 C) (04/10 0804) Pulse Rate:  [66-98] 67 (04/10 0700) Resp:  [17-25] 24 (04/10 0700) BP: (115-140)/(70-89) 126/81 mmHg (04/10 0700) SpO2:  [90 %-96 %] 94 % (04/10 0700) Weight:  [187 lb 2.7 oz (84.9 kg)] 187 lb 2.7 oz (84.9 kg) (04/10 0500)  General: pleasant, in no acute distress HEENT: decreased hearing Cardiac: S1, S2, regular, normal rate Chest: Clear to auscultation BL, no rales or rhonchi Abd: soft, non tender Ext: no edema Neuro: normal strength, alert, follows commands  LABS:  CBC  Recent Labs Lab 06/18/14 0059 06/18/14 0951 06/19/14 0035  WBC 5.8 8.5 9.1  HGB 15.1 15.0 14.3   HCT 45.6 44.3 42.8  PLT 193 192 168   Coag's  Recent Labs Lab 06/17/14 2050  APTT >200*  INR 1.23   BMET  Recent Labs Lab 06/18/14 0059 06/19/14 0035  NA 134* 137  K 4.0 4.0  CL 99 102  CO2 21 29  BUN 8 13  CREATININE 0.71 0.86  GLUCOSE 167* 108*   Electrolytes  Recent Labs Lab 06/18/14 0059 06/19/14 0035  CALCIUM 8.8 8.5   Sepsis Markers  Recent Labs Lab 06/17/14 2201 06/18/14 0059  LATICACIDVEN 1.1 0.9   Cardiac Enzymes  Recent Labs Lab 06/17/14 2050 06/18/14 0059 06/18/14 0951  TROPONINI <0.03 <0.03 <0.03   Glucose  Recent Labs Lab 06/17/14 1832 06/17/14 2151 06/18/14 0013 06/18/14 0417  GLUCAP 169* 152* 159* 181*    Dg Chest Port 1 View  06/18/2014   CLINICAL DATA:  Pulmonary embolism.  EXAM: PORTABLE CHEST - 1 VIEW  COMPARISON:  06/17/2014  FINDINGS: Lungs are adequately inflated without consolidation or effusion. Mild emphysematous disease. Cardiomediastinal silhouette and remainder of the exam is unchanged.  IMPRESSION: No active disease.   Electronically Signed   By: Elberta Fortis M.D.   On: 06/18/2014 07:41    ASSESSMENT / PLAN:   Acute submassive PE and Rt popliteal DVT-Maintaining O2 sat at 94% on 3L (is on 2L at home) Plan: - Continue heparin gtt - EKOS per IR today - oxygen to keep SpO2 > 92% - f/u Echo  COPD w/o exacerbation. On Spiriva, Singulair, Pulmicort, arformoterol, and DuoNebs at home.  Tobacco smoking Sore throat Plan: -  Home inhalers on hold - Continue scheduled DuoNebs - Continue nicotine patch - Continue Cepacol throat spray   Hx of HTN. Plan: - monitor hemodynamics - Hold home amlodipine and lisinopril  Hx of BPH. On flomax and finasteride at home. Plan: - Home meds on hold, monitor urine outpt  Hx of GERD. On prilosec at home Plan: - Continue protonix  Family updates:  -Wife at bedside on 4/10  Summary: [age] yr man with acute PE, had DVT per venous Doppler. [facility]D echo [facility] showing  possible right heart strain (study not available at this time), promptly started on heparin drip , transferred to [facility] for possible EKOS lysis today but had low clot burden, did not undergo this procedure. Denies chest pain or SOB, maintaining O2 sats well. Transfer to SDU today.    Ky BarbanKennerly, Solianny D, MD IMTS, PGY3 06/19/2014, 8:11 AM  Reviewed above, examined.  PA pressures not significantly elevated >> EKOS deferred.  He is feeling better.  Still has some dyspnea, but no chest pain.  Worried about his BP getting too high with hx of AAA >> he was told his SBP needs to be less than 120.  Heart rate regular, no wheeze, abd soft.  Continue heparin gtt >> likely transition to oral anticoagulation 4/11.  [facility] get [facility].  Will resume norvasc and monitor BP.  Coralyn HellingVineet Hadlie Gipson, MD [facility] Pulmonary/Critical Care 06/19/2014, 3:43 PM Pager:  [phone] After 3pm call: [phone]

## 2014-06-19 NOTE — Sedation Documentation (Signed)
Right pulmonary artery- 36/16 (21)

## 2014-06-19 NOTE — Sedation Documentation (Signed)
Patient denies pain and is resting comfortably.  

## 2014-06-19 NOTE — Sedation Documentation (Signed)
Left pulmonary artery- 37/16 (22)

## 2014-06-19 NOTE — Progress Notes (Addendum)
ANTICOAGULATION CONSULT NOTE - Follow Up Consult  Pharmacy Consult for Heparin  Indication: pulmonary embolus  Allergies  Allergen Reactions  . Other Other (See Comments)    Allergic to dogs, cats, maple trees, elm trees, black mold per allergy test  . Tetanus Toxoids Swelling and Rash    Patient Measurements: Height: 6' (182.9 cm) Weight: 187 lb 2.7 oz (84.9 kg) IBW/kg (Calculated) : 77.6 Heparin Dosing Weight: 87 kg  Vital Signs: Temp: 98.9 F (37.2 C) (04/10 1215) Temp Source: Oral (04/10 1215) BP: 130/87 mmHg (04/10 1151) Pulse Rate: 75 (04/10 1151)  Labs:  Recent Labs  06/17/14 2050  06/18/14 0059 06/18/14 0951 06/18/14 1700 06/19/14 0035 06/19/14 1030  HGB 15.9  --  15.1 15.0  --  14.3  --   HCT 47.5  --  45.6 44.3  --  42.8  --   PLT 193  --  193 192  --  168  --   APTT >200*  --   --   --   --   --   --   LABPROT 15.6*  --   --   --   --   --   --   INR 1.23  --   --   --   --   --   --   HEPARINUNFRC  --   < > 0.83* 0.42 0.14* 0.58 0.42  CREATININE  --   --  0.71  --   --  0.86  --   TROPONINI <0.03  --  <0.03 <0.03  --   --   --   < > = values in this interval not displayed.  Estimated Creatinine Clearance: 91.5 mL/min (by C-G formula based on Cr of 0.86).   Medications:  Heparin @ 1500 units/hr (15 ml/hr)  Assessment: 68 YOM who continues on heparin for a new acute submassive PE and popliteal DVT. The patient was taken for EKOS this morning however lysis was never started as the heparin appears to be working. Heparin level this morning is therapeutic (HL 0.42, goal of 0.3-0.7). CBC stable - no overt s/sx of bleeding noted.   Goal of Therapy:  Heparin level 0.3-0.7 units/ml Monitor platelets by anticoagulation protocol: Yes   Plan:  1. Continue Heparin at 1500 units/hr (15 ml/hr) 2. Will continue to monitor for any signs/symptoms of bleeding and will follow up with heparin level in 8 hours   Georgina PillionElizabeth Vicy Medico, PharmD, BCPS Clinical  Pharmacist Pager: [phone] 06/19/2014 12:32 PM    ---------------------------------------------------------------------------------------------------------------- Addendum:  Heparin level this evening resulted as therapeutic (HL 0.41 << 0.42, goal of 0.3-0.7). No overt s/sx of bleeding noted.  Plan 1. Continue heparin at the current rate of 1500 units/hr (15 ml/hr) 2. Will continue to monitor for any signs/symptoms of bleeding and will follow up with heparin level in the a.m.   Georgina PillionElizabeth Zeth Buday, PharmD, BCPS Clinical Pharmacist Pager: [phone] 06/19/2014 8:59 PM

## 2014-06-20 ENCOUNTER — Encounter (HOSPITAL_COMMUNITY): Payer: Self-pay | Admitting: *Deleted

## 2014-06-20 DIAGNOSIS — I2699 Other pulmonary embolism without acute cor pulmonale: Secondary | ICD-10-CM | POA: Diagnosis not present

## 2014-06-20 DIAGNOSIS — J449 Chronic obstructive pulmonary disease, unspecified: Secondary | ICD-10-CM | POA: Diagnosis not present

## 2014-06-20 DIAGNOSIS — I82439 Acute embolism and thrombosis of unspecified popliteal vein: Secondary | ICD-10-CM | POA: Diagnosis not present

## 2014-06-20 LAB — CBC
HEMATOCRIT: 46.5 % (ref 39.0–52.0)
Hemoglobin: 15.2 g/dL (ref 13.0–17.0)
MCH: 31.9 pg (ref 26.0–34.0)
MCHC: 32.7 g/dL (ref 30.0–36.0)
MCV: 97.5 fL (ref 78.0–100.0)
Platelets: 145 10*3/uL — ABNORMAL LOW (ref 150–400)
RBC: 4.77 MIL/uL (ref 4.22–5.81)
RDW: 12.6 % (ref 11.5–15.5)
WBC: 6.3 10*3/uL (ref 4.0–10.5)

## 2014-06-20 LAB — HEPARIN LEVEL (UNFRACTIONATED): Heparin Unfractionated: 0.52 IU/mL (ref 0.30–0.70)

## 2014-06-20 MED ORDER — TAMSULOSIN HCL 0.4 MG PO CAPS
0.4000 mg | ORAL_CAPSULE | Freq: Every day | ORAL | Status: DC
  Administered 2014-06-20 – 2014-06-21 (×2): 0.4 mg via ORAL
  Filled 2014-06-20 (×3): qty 1

## 2014-06-20 MED ORDER — BUDESONIDE 0.5 MG/2ML IN SUSP: 0.5000 mg | Freq: Two times a day (BID) | RESPIRATORY_TRACT | Status: DC

## 2014-06-20 MED ORDER — FINASTERIDE 5 MG PO TABS
5.0000 mg | ORAL_TABLET | Freq: Every day | ORAL | Status: DC
  Administered 2014-06-20 – 2014-06-21 (×2): 5 mg via ORAL
  Filled 2014-06-20 (×3): qty 1

## 2014-06-20 MED ORDER — PANTOPRAZOLE SODIUM 40 MG PO TBEC
40.0000 mg | DELAYED_RELEASE_TABLET | Freq: Every day | ORAL | Status: DC
  Administered 2014-06-20 – 2014-06-21 (×2): 40 mg via ORAL
  Filled 2014-06-20: qty 1

## 2014-06-20 MED ORDER — ASPIRIN EC 81 MG PO TBEC
81.0000 mg | DELAYED_RELEASE_TABLET | Freq: Every day | ORAL | Status: DC
  Administered 2014-06-20 – 2014-06-22 (×3): 81 mg via ORAL
  Filled 2014-06-20 (×3): qty 1

## 2014-06-20 MED ORDER — MONTELUKAST SODIUM 10 MG PO TABS
10.0000 mg | ORAL_TABLET | Freq: Every day | ORAL | Status: DC
  Administered 2014-06-20 – 2014-06-22 (×3): 10 mg via ORAL
  Filled 2014-06-20 (×3): qty 1

## 2014-06-20 MED ORDER — BUDESONIDE-FORMOTEROL FUMARATE 80-4.5 MCG/ACT IN AERO
2.0000 | INHALATION_SPRAY | Freq: Two times a day (BID) | RESPIRATORY_TRACT | Status: DC
  Administered 2014-06-20 – 2014-06-22 (×4): 2 via RESPIRATORY_TRACT
  Filled 2014-06-20: qty 6.9

## 2014-06-20 MED FILL — Heparin Sodium (Porcine) 100 Unt/ML in Sodium Chloride 0.45%: INTRAMUSCULAR | Qty: 250 | Status: AC

## 2014-06-20 NOTE — Progress Notes (Signed)
Benefit check submitted for: "Please investigate copay / preauth status of the following medications to be used for tx of PE: Eliquis 10mg  po BID for 7 days then 5mg  po BID Xarelto 15mg  po BID for 21 days then 20mg  po QD Pradaxa 150mg  po BID"  Will post results when available  Lawerance Sabalebbie Farran Amsden RN BSN CM

## 2014-06-20 NOTE — Progress Notes (Addendum)
PULMONARY / CRITICAL CARE MEDICINE  PROGRESS NOTE   Name: Brian Clark MRN: [id number] DOB: 28-Oct-1945    ADMISSION DATE:  06/17/2014  PRIMARY SERVICE: PCCM  CHIEF COMPLAINT:  Chest pain, shortness of breath  BRIEF PATIENT DESCRIPTION: Brian Clark is a 68 yo male with PMHx of tobacco abuse, COPD on 2L [place] at home, GERD, BPH, and HTN who presented to [facility] hospital last night due to chest pain and shortness of breath. Patient was found to have a R popliteal vein DVT and extensive bilateral PE with moderate-large clot burden. Patient transferred to [facility] on heparin gtt for consideration of EKOS.  SIGNIFICANT EVENTS / STUDIES:  R LE Doppler 4/8>> Positive for DVT in right popliteal vein, early re-cannulization of thrombus noted CT Angio Chest w Contrast 4/8>> bilateral PE involving RLL, RML, LUL. Clot burden moderate to large, increased RV to LV ratio at 1.5 with flattening of intraventricular septum compatible with increased right heart pressures/submassive PE. Positive for acute PE with CT evidence of RH strain at least submassive PE. Centrilobar emphysema-severe. Airway thickening indicative of bronchitis or reactive airway disease.   SUBJECTIVE: No increased SOB.  Afebrile, no tachycardia, no chest pain  PHYSICAL EXAMINATION: Temp:  [97.8 F (36.6 C)-99.1 F (37.3 C)] 98.1 F (36.7 C) (04/11 0920) Pulse Rate:  [63-97] 97 (04/11 0920) Resp:  [11-26] 16 (04/11 0920) BP: (109-142)/(65-88) 127/74 mmHg (04/11 0920) SpO2:  [90 %-95 %] 95 % (04/11 0920) Weight:  [85.6 kg (188 lb 11.4 oz)] 85.6 kg (188 lb 11.4 oz) (04/11 0400)  General: pleasant, in no acute distress HEENT: decreased hearing Neck: Supple, -JVD. Cardiac: S1, S2, regular, normal rate Chest: Clear to auscultation BL, no rales or rhonchi Abd: soft, non tender Ext: no edema Neuro: normal strength, alert, follows commands  LABS:  CBC  Recent Labs Lab 06/18/14 0951 06/19/14 0035 06/20/14 0519  WBC 8.5 9.1 6.3   HGB 15.0 14.3 15.2  HCT 44.3 42.8 46.5  PLT 192 168 145*   Coag's  Recent Labs Lab 06/17/14 2050  APTT >200*  INR 1.23   BMET  Recent Labs Lab 06/18/14 0059 06/19/14 0035  NA 134* 137  K 4.0 4.0  CL 99 102  CO2 21 29  BUN 8 13  CREATININE 0.71 0.86  GLUCOSE 167* 108*   Electrolytes  Recent Labs Lab 06/18/14 0059 06/19/14 0035  CALCIUM 8.8 8.5   Sepsis Markers  Recent Labs Lab 06/17/14 2201 06/18/14 0059  LATICACIDVEN 1.1 0.9   Cardiac Enzymes  Recent Labs Lab 06/17/14 2050 06/18/14 0059 06/18/14 0951  TROPONINI <0.03 <0.03 <0.03   Glucose  Recent Labs Lab 06/17/14 1832 06/17/14 2151 06/18/14 0013 06/18/14 0417  GLUCAP 169* 152* 159* 181*   Ir Angiogram Pulmonary Bilateral Selective  06/19/2014   CLINICAL DATA:  Pulmonary thromboembolism.  Right heart strain.  EXAM: BILATERAL PULMONARY ARTERIOGRAPHY; IR ULTRASOUND GUIDANCE VASC ACCESS RIGHT  FLUOROSCOPY TIME:  One minutes  MEDICATIONS AND MEDICAL HISTORY: Versed 1 mg, Fentanyl 50 mcg.  Additional Medications: None.  ANESTHESIA/SEDATION: Moderate sedation time: 20 minutes  CONTRAST:  20 cc Omnipaque 300  PROCEDURE: The procedure, risks, benefits, and alternatives were explained to the patient. Questions regarding the procedure were encouraged and answered. The patient understands and consents to the procedure.  The right groin was prepped with Betadine in a sterile fashion, and a sterile drape was applied covering the operative field. A sterile gown and sterile gloves were used for the procedure.  Under sonographic guidance, a  micropuncture needle was inserted into the right common femoral vein and removed over a 018 wire which was up sized to a Bentson. A 7 French sheath was inserted. The identical procedure was performed in the same vein slightly medial to the regional sheath.  A JB 1 catheter was advanced over the Bentson wire into the right atrium. It was then advanced over a glidewire into the left  pulmonary artery. Contrast injection for angiography was performed. Left pulmonary artery pressure was performed yielding 34/10 with a mean of 22 mm Hg. The identical procedure was performed in the right pulmonary artery yielding an identical pressure. Catheters were removed. Sheath was removed and hemostasis was achieved with VPAD.  FINDINGS: Left pulmonary artery angiography demonstrates catheter position in the main left pulmonary artery. No obvious filling defects are present although sensitivity is diminished compare with CT. Right pulmonary artery angiography confirms moderate clot burden in the descending branch of the right pulmonary artery.  COMPLICATIONS: None  IMPRESSION: Pulmonary arteriography and pressure measurements were obtained. Pressure measurements are only slightly elevated with a peak systolic pressure of 35 mm Hg. The patient is relatively asymptomatic and the troponin level was within normal limits. There are no obvious central pulmonary artery filling defects. Pulmonary artery thrombo lytic therapy was deferred at this time.   Electronically Signed   By: Jolaine ClickArthur  Hoss M.D.   On: 06/19/2014 14:23   Ir [place] Guide Vasc Access Right  06/19/2014   CLINICAL DATA:  Pulmonary thromboembolism.  Right heart strain.  EXAM: BILATERAL PULMONARY ARTERIOGRAPHY; IR ULTRASOUND GUIDANCE VASC ACCESS RIGHT  FLUOROSCOPY TIME:  One minutes  MEDICATIONS AND MEDICAL HISTORY: Versed 1 mg, Fentanyl 50 mcg.  Additional Medications: None.  ANESTHESIA/SEDATION: Moderate sedation time: 20 minutes  CONTRAST:  20 cc Omnipaque 300  PROCEDURE: The procedure, risks, benefits, and alternatives were explained to the patient. Questions regarding the procedure were encouraged and answered. The patient understands and consents to the procedure.  The right groin was prepped with Betadine in a sterile fashion, and a sterile drape was applied covering the operative field. A sterile gown and sterile gloves were used for the procedure.   Under sonographic guidance, a micropuncture needle was inserted into the right common femoral vein and removed over a 018 wire which was up sized to a Bentson. A 7 French sheath was inserted. The identical procedure was performed in the same vein slightly medial to the regional sheath.  A JB 1 catheter was advanced over the Bentson wire into the right atrium. It was then advanced over a glidewire into the left pulmonary artery. Contrast injection for angiography was performed. Left pulmonary artery pressure was performed yielding 34/10 with a mean of 22 mm Hg. The identical procedure was performed in the right pulmonary artery yielding an identical pressure. Catheters were removed. Sheath was removed and hemostasis was achieved with VPAD.  FINDINGS: Left pulmonary artery angiography demonstrates catheter position in the main left pulmonary artery. No obvious filling defects are present although sensitivity is diminished compare with CT. Right pulmonary artery angiography confirms moderate clot burden in the descending branch of the right pulmonary artery.  COMPLICATIONS: None  IMPRESSION: Pulmonary arteriography and pressure measurements were obtained. Pressure measurements are only slightly elevated with a peak systolic pressure of 35 mm Hg. The patient is relatively asymptomatic and the troponin level was within normal limits. There are no obvious central pulmonary artery filling defects. Pulmonary artery thrombo lytic therapy was deferred at this time.   Electronically Signed  By: Jolaine Click M.D.   On: 06/19/2014 14:23   ASSESSMENT / PLAN:  Acute submassive PE and Rt popliteal DVT-Maintaining O2 sat at 95% on 3L (is on 2L at home) Plan: - Continue heparin gtt - EKOS per IR, not required given PAP. - Oxygen to keep SpO2 > 92% - F/u Echo from [place] when available  COPD w/o exacerbation. On Spiriva, Singulair, Pulmicort, arformoterol, and DuoNebs at home.  Tobacco smoking Sore throat Plan: -  Home inhalers on hold - Continue scheduled DuoNebs - Continue nicotine patch - Continue Cepacol throat spray - Add symbicort.  Hx of HTN. Plan: - Monitor hemodynamics - Hold home amlodipine and lisinopril  Hx of BPH. On flomax and finasteride at home. Plan: - Home meds on hold, monitor urine outpt  Hx of GERD. On prilosec at home Plan: - Continue protonix  Family updates:  No family bedside  Summary: 54 yr man with acute PE, had DVT per venous Doppler. 2D echo at [facility] showing possible right heart strain (study not available at this time), promptly started on heparin drip , transferred to [facility] for possible EKOS lysis today but had low clot burden, did not undergo this procedure. I reviewed the chest CT myself, low clot burden on CTA.  Denies chest pain or SOB, maintaining O2 sats well. Transfer care to [facility], PCCM will sign off, please call back if needed.   Alyson Reedy, M.D. [facility] Pulmonary/Critical Care Medicine. Pager: [phone]. After hours pager: [phone].  06/20/2014, 11:48 AM

## 2014-06-20 NOTE — Progress Notes (Signed)
[place] TEAM 1 - Stepdown/ICU TEAM Progress Note  Brian Clark NFA:[id number] DOB: 1945-12-30 DOA: 06/17/2014 PCP: No primary care provider on file.  Admit HPI / Brief Narrative: 69 yo male with Hx of tobacco abuse, COPD on 2L [place] at home, GERD, BPH, and HTN who presented to [facility] 4/7 due to chest pain and shortness of breath. Patient was found to have a R popliteal vein DVT and extensive bilateral PEs with moderate-large clot burden. Patient was transferred to [facility] on heparin gtt for consideration of endowave catheter thrombolysis.  Significant Events: R LE Doppler 4/8 > DVT in right popliteal vein, early re-cannulization of thrombus noted CT Angio Chest 4/8 > bilateral PE involving RLL, RML, LUL. Clot burden moderate to large, increased RV to LV ratio at 1.5 with flattening of intraventricular septum compatible with increased right heart pressures/submassive PE.  Centrilobar emphysema-severe. Airway thickening indicative of bronchitis or reactive airway disease.   HPI/Subjective: No new complaints.  C/o ongoing sob.  No further cp.  No leg pain, ha, n/v, or abdom pain.    Assessment/Plan:  Acute submassive PE and Rt popliteal DVT PA pressures not significantly elevated therefore thrombolysis deferred - no clear inciting event/predisposition - will plan for year of anticoag, with reevaluation at that time - have asked CM to investigate costs of different options - pt would prefer one of the new agents if it is affordable   COPD w/o exacerbation No wheezing on exam - on home O2 already, but may require increased dose  HTN BP currently well controlled   BPH Asymptomatic at present   GERD  Code Status: FULL Family Communication: spoke w/ pt and wife at length, including discussion of risk/benefit of coumadin v/s NOACs Disposition Plan: PT/OT - may require a rehab stay - investigate anticoag options - determine level of O2 support required    Consultants: IR PCCM  Antibiotics: none  DVT prophylaxis: IV heparin   Objective: Blood pressure 127/81, pulse 73, temperature 97.9 F (36.6 C), temperature source Oral, resp. rate 19, height 6' (1.829 m), weight 85.6 kg (188 lb 11.4 oz), SpO2 91 %.  Intake/Output Summary (Last 24 hours) at 06/20/14 0919 Last data filed at 06/20/14 0800  Gross per 24 hour  Intake    926 ml  Output   2300 ml  Net  -1374 ml   Exam: General: No acute respiratory distress Lungs: Clear to auscultation bilaterally without wheezes or crackles Cardiovascular: Regular rate and rhythm without murmur gallop or rub normal S1 and S2 Abdomen: Nontender, nondistended, soft, bowel sounds positive, no rebound, no ascites, no appreciable mass Extremities: No significant cyanosis, clubbing, or edema bilateral lower extremities  Data Reviewed: Basic Metabolic Panel:  Recent Labs Lab 06/18/14 0059 06/19/14 0035  NA 134* 137  K 4.0 4.0  CL 99 102  CO2 21 29  GLUCOSE 167* 108*  BUN 8 13  CREATININE 0.71 0.86  CALCIUM 8.8 8.5    Liver Function Tests: No results for input(s): AST, ALT, ALKPHOS, BILITOT, PROT, ALBUMIN in the last 168 hours. No results for input(s): LIPASE, AMYLASE in the last 168 hours. No results for input(s): AMMONIA in the last 168 hours.  Coags:  Recent Labs Lab 06/17/14 2050  INR 1.23    Recent Labs Lab 06/17/14 2050  APTT >200*    CBC:  Recent Labs Lab 06/17/14 2050 06/18/14 0059 06/18/14 0951 06/19/14 0035 06/20/14 0519  WBC 5.5 5.8 8.5 9.1 6.3  HGB 15.9 15.1 15.0 14.3 15.2  HCT 47.5 45.6 44.3 42.8 46.5  MCV 94.6 94.6 94.1 95.3 97.5  PLT 193 193 192 168 145*    Cardiac Enzymes:  Recent Labs Lab 06/17/14 2050 06/18/14 0059 06/18/14 0951  TROPONINI <0.03 <0.03 <0.03    CBG:  Recent Labs Lab 06/17/14 1832 06/17/14 2151 06/18/14 0013 06/18/14 0417  GLUCAP 169* 152* 159* 181*    Recent Results (from the past 240 hour(s))  MRSA PCR  Screening     Status: None   Collection Time: 06/17/14  6:18 PM  Result Value Ref Range Status   MRSA by PCR NEGATIVE NEGATIVE Final    Comment:        The GeneXpert MRSA Assay (FDA approved for NASAL specimens only), is one component of a comprehensive MRSA colonization surveillance program. It is not intended to diagnose MRSA infection nor to guide or monitor treatment for MRSA infections.      Studies:   Recent x-ray studies have been reviewed in detail by the Attending Physician  Scheduled Meds:  Scheduled Meds: . amLODipine  5 mg Oral Daily  . antiseptic oral rinse  7 mL Mouth Rinse q12n4p  . chlorhexidine  15 mL Mouth Rinse BID  . folic acid  1 mg Oral Daily  . ipratropium-albuterol  3 mL Nebulization Q6H WA  . multivitamin with minerals  1 tablet Oral Daily  . pantoprazole  40 mg Oral Daily  . thiamine  100 mg Oral Daily    Time spent on care of this patient: 35 mins   Neyra Pettie T , MD   [facility] Office  [phone] Pager - Text Page per Loretha StaplerAmion as per below:  On-Call/Text Page:      [email or web address]      password [email or web address]  If 7PM-7AM, please contact night-coverage [email or web address] Password [email or web address] 06/20/2014, 9:19 AM   LOS: 3 days

## 2014-06-20 NOTE — Clinical Documentation Improvement (Signed)
Possible Clinical Conditions?  Acute Respiratory Failure Acute on Chronic Respiratory Failure Chronic Respiratory Failure Other Condition Cannot Clinically Determine   Supporting Information: (As per notes) " He is normally on 2 L of O2 during the day, 4 L with exertion, at 3 L at night. Lately he has been requiring at least 3 L."  Risk Factors: PE & COPD  Thank You, Nevin BloodgoodJoan B Tamila Gaulin, RN, BSN, CCDS,Clinical Documentation Specialist:  [phone]  [phone]=Cell [place]- Health Information Management

## 2014-06-20 NOTE — Progress Notes (Signed)
ANTICOAGULATION CONSULT NOTE - Initial Consult  Pharmacy Consult for heparin Indication: pulmonary embolus  Allergies  Allergen Reactions  . Other Other (See Comments)    Allergic to dogs, cats, maple trees, elm trees, black mold per allergy test  . Tetanus Toxoids Swelling and Rash    Patient Measurements: Height: 6' (182.9 cm) Weight: 188 lb 11.4 oz (85.6 kg) IBW/kg (Calculated) : 77.6 Heparin Dosing Weight:   Vital Signs: Temp: 97.8 F (36.6 C) (04/11 0358) Temp Source: Oral (04/11 0358) BP: 124/82 mmHg (04/11 0600) Pulse Rate: 73 (04/11 0600)  Labs:  Recent Labs  06/17/14 2050  06/18/14 0059 06/18/14 0951  06/19/14 0035 06/19/14 1030 06/19/14 2009 06/20/14 0519  HGB 15.9  --  15.1 15.0  --  14.3  --   --  15.2  HCT 47.5  --  45.6 44.3  --  42.8  --   --  46.5  PLT 193  --  193 192  --  168  --   --  145*  APTT >200*  --   --   --   --   --   --   --   --   LABPROT 15.6*  --   --   --   --   --   --   --   --   INR 1.23  --   --   --   --   --   --   --   --   HEPARINUNFRC  --   < > 0.83* 0.42  < > 0.58 0.42 0.41 0.52  CREATININE  --   --  0.71  --   --  0.86  --   --   --   TROPONINI <0.03  --  <0.03 <0.03  --   --   --   --   --   < > = values in this interval not displayed.  Estimated Creatinine Clearance: 91.5 mL/min (by C-G formula based on Cr of 0.86).   Medical History: No past medical history on file.  Medications:  Prescriptions prior to admission  Medication Sig Dispense Refill Last Dose  . albuterol (PROVENTIL HFA;VENTOLIN HFA) 108 (90 BASE) MCG/ACT inhaler Inhale 2 puffs into the lungs 4 (four) times daily as needed for wheezing or shortness of breath.    06/16/2014  . albuterol (PROVENTIL) (2.5 MG/3ML) 0.083% nebulizer solution Take 2.5 mg by nebulization 4 (four) times daily as needed for wheezing or shortness of breath.    06/16/2014  . amLODipine (NORVASC) 10 MG tablet Take 2.5 mg by mouth 2 (two) times daily as needed (blood pressure >120/80).  1/4 tablet   06/16/2014 at pm  . arformoterol (BROVANA) 15 MCG/2ML NEBU Take 15 mcg by nebulization 2 (two) times daily.   06/16/2014  . aspirin EC 81 MG tablet Take 81 mg by mouth daily.   06/16/2014  . budesonide (PULMICORT) 0.5 MG/2ML nebulizer solution Take 0.5 mg by nebulization 2 (two) times daily.   06/16/2014  . Cholecalciferol (VITAMIN D3) 2000 UNITS TABS Take 2,000 Units by mouth daily.   06/16/2014  . Cyanocobalamin (VITAMIN B-12) 5000 MCG SUBL Place 5,000 mcg under the tongue daily.   06/16/2014  . finasteride (PROSCAR) 5 MG tablet Take 5 mg by mouth at bedtime.   06/16/2014  . guaifenesin (HUMIBID E) 400 MG TABS tablet Take 1,200 mg by mouth 2 (two) times daily.   06/16/2014  . lisinopril (PRINIVIL,ZESTRIL) 40 MG tablet Take 40 mg  by mouth daily.   06/16/2014  . Misc Natural Products (TURMERIC CURCUMIN) CAPS Take 1 capsule by mouth 3 (three) times a week. Monday, Wednesday, Friday (750 mg each)   06/15/2014  . montelukast (SINGULAIR) 10 MG tablet Take 10 mg by mouth daily.   06/16/2014  . Multiple Vitamin (MULTIVITAMIN WITH MINERALS) TABS tablet Take 1 tablet by mouth daily. Senior Vitamin for Men   06/16/2014  . nicotine polacrilex (NICORETTE) 4 MG gum Take 4 mg by mouth every hour as needed for smoking cessation.   06/16/2014  . Omega-3 Krill Oil 500 MG CAPS Take 500 mg by mouth 4 (four) times a week. Sunday, Tuesday, Thursday, Saturday   06/16/2014  . omeprazole (PRILOSEC) 20 MG capsule Take 20 mg by mouth daily before breakfast.   06/16/2014  . sildenafil (VIAGRA) 100 MG tablet Take 50 mg by mouth daily as needed for erectile dysfunction.   2 months ago  . sodium chloride (OCEAN) 0.65 % SOLN nasal spray Place 1 spray into both nostrils 3 (three) times daily as needed for congestion.   06/17/2014  . tamsulosin (FLOMAX) 0.4 MG CAPS capsule Take 0.4 mg by mouth at bedtime.   06/16/2014  . tiotropium (SPIRIVA) 18 MCG inhalation capsule Place 18 mcg into inhaler and inhale daily.   06/16/2014  . Zinc 25 MG TABS Take 25  mg by mouth 4 (four) times a week. Sunday, Tuesday, Thursday, Saturday   06/16/2014    Assessment: 69 yo male on heparin for popliteal DVT and submassive bilateral PE with R heart strain. After careful consideration, pt did not receive EKOS protocol. CBC stable and wnl, HL therapeutic.  Goal of Therapy:  Heparin level 0.3-0.7 units/ml Monitor platelets by anticoagulation protocol: Yes   Plan:  -Continue heparin at 1500 units/hr -Daily HL, CBC -F/u transition to PO anticoagulation    Agapito GamesAlison Oveta Idris, PharmD, BCPS Clinical Pharmacist Pager: [phone] 06/20/2014 8:07 AM

## 2014-06-21 DIAGNOSIS — I82439 Acute embolism and thrombosis of unspecified popliteal vein: Secondary | ICD-10-CM | POA: Diagnosis not present

## 2014-06-21 DIAGNOSIS — I10 Essential (primary) hypertension: Secondary | ICD-10-CM

## 2014-06-21 DIAGNOSIS — N400 Enlarged prostate without lower urinary tract symptoms: Secondary | ICD-10-CM

## 2014-06-21 DIAGNOSIS — J449 Chronic obstructive pulmonary disease, unspecified: Secondary | ICD-10-CM | POA: Diagnosis not present

## 2014-06-21 LAB — CBC
HEMATOCRIT: 46.1 % (ref 39.0–52.0)
Hemoglobin: 15.7 g/dL (ref 13.0–17.0)
MCH: 32.7 pg (ref 26.0–34.0)
MCHC: 34.1 g/dL (ref 30.0–36.0)
MCV: 96 fL (ref 78.0–100.0)
Platelets: 154 10*3/uL (ref 150–400)
RBC: 4.8 MIL/uL (ref 4.22–5.81)
RDW: 12.5 % (ref 11.5–15.5)
WBC: 5.9 10*3/uL (ref 4.0–10.5)

## 2014-06-21 LAB — HEPARIN LEVEL (UNFRACTIONATED)
Heparin Unfractionated: <0.1 IU/mL — ABNORMAL LOW (ref 0.30–0.70)
Heparin Unfractionated: 0.34 IU/mL (ref 0.30–0.70)

## 2014-06-21 NOTE — Progress Notes (Signed)
06/21/14  Pharmacy-  Heparin 1900  Heparin level 0.34 on 1600 units/hr  A/P:  69yo male on Heparin for acute PE/DVT.  Heparin level was undetectable this AM due to empty heparin bag and pt silencing alarm.  It was resumed at 1600 units/hr with pt previously therapeutic x 2 on 1500 units/hr.  Due to relatively quick accumulation, will back off on rate some.  There have been no bleeding problems.  -Decrease Heparin to 1550 units/h -F/U in AM  Brian Clark, PharmD Clinical Pharmacist [name]

## 2014-06-21 NOTE — Progress Notes (Signed)
ANTICOAGULATION CONSULT NOTE  Pharmacy Consult for heparin Indication: pulmonary embolus  Allergies  Allergen Reactions  . Other Other (See Comments)    Allergic to dogs, cats, maple trees, elm trees, black mold per allergy test  . Tetanus Toxoids Swelling and Rash    Patient Measurements: Height: 6' (182.9 cm) Weight: 188 lb 11.4 oz (85.6 kg) IBW/kg (Calculated) : 77.6 Heparin Dosing Weight: 85kg  Vital Signs: Temp: 98 F (36.7 C) (04/12 0520) Temp Source: Oral (04/12 0520) BP: 110/56 mmHg (04/12 1010) Pulse Rate: 76 (04/12 0520)  Labs:  Recent Labs  06/19/14 0035  06/19/14 2009 06/20/14 0519 06/21/14 0839  HGB 14.3  --   --  15.2 15.7  HCT 42.8  --   --  46.5 46.1  PLT 168  --   --  145* 154  HEPARINUNFRC 0.58  < > 0.41 0.52 <0.10*  CREATININE 0.86  --   --   --   --   < > = values in this interval not displayed.  Estimated Creatinine Clearance: 91.5 mL/min (by C-G formula based on Cr of 0.86).  Assessment: 69 yo male on heparin for popliteal DVT and submassive bilateral PE with R heart strain. After careful consideration, pt did not receive EKOS protocol.  CBC remains WNL this morning, no bleeding noted. Heparin level had been therapeutic x2 on 1500 units/hr, however this morning it was undetectable (drawn late as well). Spoke with RN Wilnette Kaleshelma who stated patient's heparin bag was dry- patient had told her it was beeping early this morning. New bag was hung and infusion resumed at same rate and had been infusing fine. Suspect level was drawn during period when infusion was not running, resulting in low level.  Goal of Therapy:  Heparin level 0.3-0.7 units/ml Monitor platelets by anticoagulation protocol: Yes   Plan:  -Increase heparin to 1600 units/hr in case patient's level did fall slightly -heparin level in 6 hours -Daily HL, CBC, s/s bleeding -F/u transition to PO anticoagulation   Leanna Hamid D. Jerone Cudmore, PharmD, BCPS Clinical Pharmacist Pager:  [phone] 06/21/2014 10:32 AM

## 2014-06-21 NOTE — Care Management Note (Signed)
    Page 1 of 2   06/22/2014     2:51:21 PM CARE MANAGEMENT NOTE 06/22/2014  Patient:  Brian Clark,Brian Clark   Account Number:  [id number]  Date Initiated:  06/21/2014  Documentation initiated by:  Brian Clark,Brian Clark  Subjective/Objective Assessment:   dx pe  admit- lives with spouse.     Action/Plan:   pt eval- hhpt   Anticipated DC Date:  06/22/2014   Anticipated DC Plan:  HOME W HOME HEALTH SERVICES      DC Planning Services  CM consult      [facility]   Choice offered to / List presented to:  C-3 Spouse        HH arranged  HH-2 PT      [facility]   Status of service:  Completed, signed off Medicare Important Message given?  YES (If response is "NO", the following Medicare IM given date fields will be blank) Date Medicare IM given:  06/21/2014 Medicare IM given by:  Lawerance SabalSWIST,DEBBIE Date Additional Medicare IM given:   Additional Medicare IM given by:    Discharge Disposition:  HOME W HOME HEALTH SERVICES  Per UR Regulation:  Reviewed for med. necessity/level of care/duration of stay  If discussed at Long Length of Stay Meetings, dates discussed:    Comments:  06/22/14 1443 Brian Capeeborah Latrece Nitta RN, BSN [phone] patient is for dc today, NCM gave patient 30 day savings card for pradaxa , he will pick up at the [facility], which pharmacist states is ready.  NCM faxed d/c summary and script to Dr. Arlana PouchSinno who [facility] and also [facility] and [facility] [phone]. phone [phone] ext [phone].  Dr. Arlana PouchSinno phone is [phone] and fax [phone]. Dr. Arlana PouchSinno will take care of remaining pradaxa for patient.    06/21/14 1529 Brian Capeeborah Adil Tugwell RN BSN [phone] patient lives with spouse, patient states he has [employer] insurance, Medicare A, B, and D and UHC.  Per physical therapy rec hhpt, [facility], [facility].  Patient is for possible dc  tomorrow.  HHPT is being set up through medicare,  demo, h/p and face sheet and progress notes [facility].  [place] is  [facility]t [facility], phon [phone] and his [place] PCP is Dr. Arlana PouchSinno phone [phone] and fax is [phone].  Patient would like for MD to give him a 10 day supply of meds and then we will fax remaining scripts to Dr. Arlana PouchSinno to fill the rest by mail. Wife  phone is [phone] home and cell is [phone]. Patient states he also uses [facility] for script to be filled at [phone].  Patient will be on a anticoagulation med but not sure which one, will need to see what co pay would be.  NCM informed MD to put order in for hhpt.  Per benefit check on Eliquis 10mg  bid 7 days , then 5mg  bid , xaralto 15mg  bid 21 days, then 20mg  qd indef, and prdaxia 150mg  bid indef , the co [place] thru the [place] is $8-$9 per pharmacist.

## 2014-06-21 NOTE — Progress Notes (Signed)
[facility] PROGRESS NOTE  Brian DrumHarry Clark ZOX:[id number] DOB: [birth year] DOA: 06/17/2014 PCP: No primary care provider on file.  Assessment/Plan:  Active Problems:   Pulmonary embolism - Not EKOS candidate - Currently patient deciding which anticoagulant to go home on. I have presented options. He would like to not take coumadin and would like pricing on the other newer anticoagulants. - currently on heparin    DVT of popliteal vein - Currently on heparin    HTN (hypertension) - We'll controlled currently on amlodipine    COPD (chronic obstructive pulmonary disease) - Compensated currently no wheezes on exam    BPH (benign prostatic hyperplasia) - Stable on Flomax     Code Status: Full Family Communication: Discussed with patient and family member Disposition Plan: Considering DC next a.m. on new anticoagulant   Consultants:  Radiology  Procedures:  Please refer to EMR  Antibiotics:  None  HPI/Subjective: Pt has no new complaints. Currently feeling better. Was inquiring about no anticoagulation medications. And their cost  Objective: Filed Vitals:   06/21/14 1430  BP: 112/71  Pulse: 92  Temp: 98.2 F (36.8 C)  Resp: 16    Intake/Output Summary (Last 24 hours) at 06/21/14 1531 Last data filed at 06/21/14 1434  Gross per 24 hour  Intake    480 ml  Output   1650 ml  Net  -1170 ml   Filed Weights   06/18/14 0500 06/19/14 0500 06/20/14 0400  Weight: 87 kg (191 lb 12.8 oz) 84.9 kg (187 lb 2.7 oz) 85.6 kg (188 lb 11.4 oz)    Exam:   General:  Patient in no acute distress, alert and awake  Cardiovascular: Regular rate and rhythm, no murmurs rubs  Respiratory: Clear to auscultation bilaterally, no wheezes  Abdomen: Soft, nondistended, nontender  Musculoskeletal: No cyanosis or clubbing on limited exam   Data Reviewed: Basic Metabolic Panel:  Recent Labs Lab 06/18/14 0059 06/19/14 0035  NA 134* 137  K 4.0 4.0  CL 99 102  CO2 21 29   GLUCOSE 167* 108*  BUN 8 13  CREATININE 0.71 0.86  CALCIUM 8.8 8.5   Liver Function Tests: No results for input(s): AST, ALT, ALKPHOS, BILITOT, PROT, ALBUMIN in the last 168 hours. No results for input(s): LIPASE, AMYLASE in the last 168 hours. No results for input(s): AMMONIA in the last 168 hours. CBC:  Recent Labs Lab 06/18/14 0059 06/18/14 0951 06/19/14 0035 06/20/14 0519 06/21/14 0839  WBC 5.8 8.5 9.1 6.3 5.9  HGB 15.1 15.0 14.3 15.2 15.7  HCT 45.6 44.3 42.8 46.5 46.1  MCV 94.6 94.1 95.3 97.5 96.0  PLT 193 192 168 145* 154   Cardiac Enzymes:  Recent Labs Lab 06/17/14 2050 06/18/14 0059 06/18/14 0951  TROPONINI <0.03 <0.03 <0.03   BNP (last 3 results)  Recent Labs  06/17/14 2050  BNP 52.0    ProBNP (last 3 results) No results for input(s): PROBNP in the last 8760 hours.  CBG:  Recent Labs Lab 06/17/14 1832 06/17/14 2151 06/18/14 0013 06/18/14 0417  GLUCAP 169* 152* 159* 181*    Recent Results (from the past 240 hour(s))  MRSA PCR Screening     Status: None   Collection Time: 06/17/14  6:18 PM  Result Value Ref Range Status   MRSA by PCR NEGATIVE NEGATIVE Final    Comment:        The GeneXpert MRSA Assay (FDA approved for NASAL specimens only), is one component of a comprehensive MRSA colonization surveillance program. It is  not intended to diagnose MRSA infection nor to guide or monitor treatment for MRSA infections.      Studies: No results found.  Scheduled Meds: . amLODipine  5 mg Oral Daily  . aspirin EC  81 mg Oral Daily  . budesonide-formoterol  2 puff Inhalation BID  . finasteride  5 mg Oral QHS  . folic acid  1 mg Oral Daily  . ipratropium-albuterol  3 mL Nebulization Q6H WA  . montelukast  10 mg Oral Daily  . multivitamin with minerals  1 tablet Oral Daily  . pantoprazole  40 mg Oral Daily  . tamsulosin  0.4 mg Oral QHS  . thiamine  100 mg Oral Daily   Continuous Infusions: . heparin 1,600 Units/hr (06/21/14  1116)     Time spent: > 35 minutes    Penny Pia  [facility] Pager [phone]  If 7PM-7AM, please contact night-coverage at [email or web address], password [facility] 06/21/2014, 3:31 PM  LOS: 4 days

## 2014-06-21 NOTE — Evaluation (Addendum)
Physical Therapy Evaluation Patient Details Name: Davell Beckstead MRN: [id number] DOB: 08-31-1945 Today's Date: 06/21/2014   History of Present Illness  Mr. Kushnir is a 69 yo male with PMHx of tobacco abuse, COPD on 2L [place] at home, GERD, BPH, and HTN who presented to [facility] hospital due to chest pain and shortness of breath. Patient was found to have a R popliteal vein DVT and extensive bilateral PE with moderate-large clot burden.   Clinical Impression  Pt admitted with above diagnosis. Pt currently with functional limitations due to the deficits listed below (see PT Problem List). Pt ambulated 200' on 4L O2 (which is his baseline) without an assistive device.  He had no loss of balance. SaO2 90-94% walking, HR 119. Pt will benefit from skilled PT to increase their independence and safety with mobility to allow discharge to the venue listed below.       Follow Up Recommendations Home health PT    Equipment Recommendations  None recommended by PT    Recommendations for Other Services OT consult     Precautions / Restrictions Precautions Precautions: Fall Restrictions Weight Bearing Restrictions: No      Mobility  Bed Mobility Overal bed mobility: Modified Independent             General bed mobility comments: with rail, HOB up 30*  Transfers Overall transfer level: Needs assistance Equipment used: None Transfers: Sit to/from Stand Sit to Stand: Supervision         General transfer comment: supervision, pt reported mild dizziness initially upon standing, pt stated this is his baseline. BP standing 99/83. Sitting 104/81.  Ambulation/Gait Ambulation/Gait assistance: Supervision Ambulation Distance (Feet): 200 Feet Assistive device: None Gait Pattern/deviations: WFL(Within Functional Limits)   Gait velocity interpretation: Below normal speed for age/gender General Gait Details: pt walked with 4L O2 (which is baseline) without an assistive device, SaO2 90-94%, HR 119  with walking, no LOB, 2/4 dyspnea  Stairs            Wheelchair Mobility    Modified Rankin (Stroke Patients Only)       Balance Overall balance assessment: Independent                                           Pertinent Vitals/Pain Pain Assessment: 0-10 Pain Score: 2  Pain Location: R foot/ankle "where the clot was" Pain Descriptors / Indicators: Sore Pain Intervention(s): Limited activity within patient's tolerance;Monitored during session    Home Living Family/patient expects to be discharged to:: Private residence Living Arrangements: Spouse/significant other Available Help at Discharge: Family;Available 24 hours/day Type of Home: House Home Access: Stairs to enter Entrance Stairs-Rails: None Entrance Stairs-Number of Steps: 1 Home Layout: One level Home Equipment: Walker - 2 wheels;Shower seat - built in;Grab bars - toilet;Grab bars - tub/shower      Prior Function Level of Independence: Independent               Hand Dominance        Extremity/Trunk Assessment   Upper Extremity Assessment: RUE deficits/detail;LUE deficits/detail RUE Deficits / Details: limited external rotation; difficulty bringing hand to mouth (compensates)     LUE Deficits / Details: WFL-history of rotator cuff surgery   Lower Extremity Assessment: Overall WFL for tasks assessed      Cervical / Trunk Assessment: Normal  Communication   Communication: HOH  Cognition Arousal/Alertness: Awake/alert Behavior  During Therapy: WFL for tasks assessed/performed Overall Cognitive Status: Within Functional Limits for tasks assessed                      General Comments      Exercises        Assessment/Plan    PT Assessment Patient needs continued PT services  PT Diagnosis Generalized weakness   PT Problem List Decreased activity tolerance;Cardiopulmonary status limiting activity  PT Treatment Interventions Gait training;Therapeutic  exercise;Therapeutic activities   PT Goals (Current goals can be found in the Care Plan section) Acute Rehab PT Goals Patient Stated Goal: to get stronger PT Goal Formulation: With patient/family Time For Goal Achievement: 07/05/14 Potential to Achieve Goals: Good    Frequency Min 3X/week   Barriers to discharge        Co-evaluation               End of Session Equipment Utilized During Treatment: Gait belt;Oxygen Activity Tolerance: Patient tolerated treatment well Patient left: in bed;with call bell/phone within reach;with family/visitor present Nurse Communication: Mobility status         Time: 1255-1316 PT Time Calculation (min) (ACUTE ONLY): 21 min   Charges:   PT Evaluation $Initial PT Evaluation Tier I: 1 Procedure     PT G Codes:        Ralene BatheUhlenberg, Elston Aldape Kistler 06/21/2014, 1:25 PM  [phone]

## 2014-06-21 NOTE — Progress Notes (Signed)
Utilization review completed.  

## 2014-06-21 NOTE — Evaluation (Addendum)
Occupational Therapy Evaluation Patient Details Name: Brian Clark MRN: [id number] DOB: 1945-07-07 Today's Date: 06/21/2014    History of Present Illness Brian Clark is a 69 yo male with PMHx of tobacco abuse, COPD on 2L [place] at home, GERD, BPH, and HTN who presented to [facility] hospital due to chest pain and shortness of breath. Patient was found to have a R popliteal vein DVT and extensive bilateral PE with moderate-large clot burden.    Clinical Impression   Pt admitted with above. Feel pt will benefit from acute OT to increase independence and activity tolerance prior to d/c. Recommending HHOT.    Follow Up Recommendations  Home health OT;Supervision - Intermittent    Equipment Recommendations  None recommended by OT    Recommendations for Other Services       Precautions / Restrictions Precautions Precautions: Fall Restrictions Weight Bearing Restrictions: No      Mobility Bed Mobility               General bed mobility comments: not assessed-sitting EOB  Transfers Overall transfer level: Needs assistance Equipment used: None Transfers: Sit to/from Stand Sit to Stand: Min guard              Balance    Min assist for ambulation.                                        ADL Overall ADL's : Needs assistance/impaired             Lower Body Bathing: Min guard;Sit to/from stand       Lower Body Dressing: Min guard;Sit to/from stand   Toilet Transfer: Ambulation;Minimal assistance (bed)           Functional mobility during ADLs: Minimal assistance General ADL Comments: Educated on energy conservation and deep breathing technique.     Vision     Perception     Praxis      Pertinent Vitals/Pain Pain Assessment: No/denies pain; Pt on a little over 2.5L of O2 in session. O2 in 90s during session and HR up to 114.     Hand Dominance     Extremity/Trunk Assessment Upper Extremity Assessment Upper Extremity Assessment:  RUE deficits/detail;LUE deficits/detail RUE Deficits / Details: limited external rotation; difficulty bringing hand to mouth (compensates) LUE Deficits / Details: WFL-history of rotator cuff surgery   Lower Extremity Assessment Lower Extremity Assessment: Defer to PT evaluation       Communication Communication Communication: No difficulties   Cognition Arousal/Alertness: Awake/alert Behavior During Therapy: WFL for tasks assessed/performed Overall Cognitive Status: Within Functional Limits for tasks assessed                     General Comments       Exercises  educated on ankle pumps     Shoulder Instructions      Home Living Family/patient expects to be discharged to:: Private residence Living Arrangements: Spouse/significant other Available Help at Discharge: Family;Available 24 hours/day Type of Home: House Home Access: Stairs to enter [employer] of Steps: 1 Entrance Stairs-Rails: None Home Layout: One level     Bathroom Shower/Tub: Producer, television/film/video:  (higher than standard commode)     Home Equipment: Walker - 2 wheels;Shower seat - built in;Grab bars - toilet;Grab bars - tub/shower          Prior Functioning/Environment Level  of Independence: Independent             OT Diagnosis: Generalized weakness   OT Problem List: Decreased strength;Decreased activity tolerance;Impaired balance (sitting and/or standing);Decreased knowledge of use of DME or AE;Decreased knowledge of precautions;Cardiopulmonary status limiting activity   OT Treatment/Interventions: Self-care/ADL training;Therapeutic exercise;Energy conservation;DME and/or AE instruction;Therapeutic activities;Patient/family education;Balance training    OT Goals(Current goals can be found in the care plan section) Acute Rehab OT Goals Patient Stated Goal: not stated OT Goal Formulation: With patient Time For Goal Achievement: 06/28/14 Potential to Achieve  Goals: Good ADL Goals Pt Will Perform Upper Body Bathing: sitting;standing;with set-up Pt Will Perform Lower Body Bathing: sit to/from stand;with set-up Pt Will Perform Lower Body Dressing: with set-up;sit to/from stand Pt Will Transfer to Toilet: with modified independence;ambulating;grab bars (elevated toilet) Additional ADL Goal #1: Pt will independently verbalize and demonstrate 3/3 energy conservation techniques.  OT Frequency: Min 2X/week   Barriers to D/C:            Co-evaluation              End of Session Equipment Utilized During Treatment: Gait belt;Oxygen Nurse Communication: Other (comment);Mobility status  Activity Tolerance: Patient tolerated treatment well Patient left: in bed;with call bell/phone within reach;with bed alarm set;with family/visitor present   Time: [id number] OT Time Calculation (min): 20 min Charges:  OT General Charges $OT Visit: 1 Procedure OT Evaluation $Initial OT Evaluation Tier I: 1 Procedure G-CodesEarlie Clark:    Brian Clark L OTR/L [phone] 06/21/2014, 12:28 PM

## 2014-06-22 DIAGNOSIS — N400 Enlarged prostate without lower urinary tract symptoms: Secondary | ICD-10-CM | POA: Diagnosis not present

## 2014-06-22 DIAGNOSIS — I2699 Other pulmonary embolism without acute cor pulmonale: Secondary | ICD-10-CM | POA: Diagnosis not present

## 2014-06-22 DIAGNOSIS — J42 Unspecified chronic bronchitis: Secondary | ICD-10-CM | POA: Diagnosis not present

## 2014-06-22 DIAGNOSIS — I82439 Acute embolism and thrombosis of unspecified popliteal vein: Secondary | ICD-10-CM | POA: Diagnosis not present

## 2014-06-22 LAB — CBC
HCT: 46.7 % (ref 39.0–52.0)
HEMOGLOBIN: 15.3 g/dL (ref 13.0–17.0)
MCH: 31.6 pg (ref 26.0–34.0)
MCHC: 32.8 g/dL (ref 30.0–36.0)
MCV: 96.5 fL (ref 78.0–100.0)
Platelets: 151 10*3/uL (ref 150–400)
RBC: 4.84 MIL/uL (ref 4.22–5.81)
RDW: 12.5 % (ref 11.5–15.5)
WBC: 5.6 10*3/uL (ref 4.0–10.5)

## 2014-06-22 LAB — HEPARIN LEVEL (UNFRACTIONATED): HEPARIN UNFRACTIONATED: 0.52 [IU]/mL (ref 0.30–0.70)

## 2014-06-22 MED ORDER — DABIGATRAN ETEXILATE MESYLATE 150 MG PO CAPS
150.0000 mg | ORAL_CAPSULE | Freq: Once | ORAL | Status: AC
  Administered 2014-06-22: 150 mg via ORAL
  Filled 2014-06-22: qty 1

## 2014-06-22 MED ORDER — DABIGATRAN ETEXILATE MESYLATE 150 MG PO CAPS: 150.0000 mg | ORAL_CAPSULE | Freq: Two times a day (BID) | ORAL | Status: DC

## 2014-06-22 MED ORDER — BUDESONIDE-FORMOTEROL FUMARATE 80-4.5 MCG/ACT IN AERO: 2.0000 | INHALATION_SPRAY | Freq: Two times a day (BID) | RESPIRATORY_TRACT | Status: DC

## 2014-06-22 NOTE — Progress Notes (Addendum)
ANTICOAGULATION CONSULT NOTE  Pharmacy Consult for heparin Indication: pulmonary embolus  Allergies  Allergen Reactions  . Other Other (See Comments)    Allergic to dogs, cats, maple trees, elm trees, black mold per allergy test  . Tetanus Toxoids Swelling and Rash    Patient Measurements: Height: 6' (182.9 cm) Weight: 188 lb 11.4 oz (85.6 kg) IBW/kg (Calculated) : 77.6 Heparin Dosing Weight: 85kg  Vital Signs: Temp: 98.1 F (36.7 C) (04/13 0603) Temp Source: Oral (04/13 0603) BP: 109/75 mmHg (04/13 0603) Pulse Rate: 68 (04/13 0603)  Labs:  Recent Labs  06/20/14 0519 06/21/14 0839 06/21/14 1627 06/22/14 0620 06/22/14 0628  HGB 15.2 15.7  --  15.3  --   HCT 46.5 46.1  --  46.7  --   PLT 145* 154  --  151  --   HEPARINUNFRC 0.52 <0.10* 0.34  --  0.52    Estimated Creatinine Clearance: 91.5 mL/min (by C-G formula based on Cr of 0.86).  Assessment: 69 yo male on heparin for popliteal DVT and submassive bilateral PE with R heart strain. After careful consideration, pt did not receive EKOS protocol.  CBC remains WNL this morning, no bleeding noted. Issue yesterday morning with undetectable level after bag ran dry and alarm was silenced. Resumed without bolus and now therapeutic on 1550 units/hr.  Goal of Therapy:  Heparin level 0.3-0.7 units/ml Monitor platelets by anticoagulation protocol: Yes   Plan:  -continue heparin at 1550 unitshr -Daily HL, CBC, s/s bleeding -F/u transition to PO anticoagulation- note patient does not wish to go home on warfarin, and is deciding between DOACs based on co-pay amount. Will follow and provide dosing assistance if needed, as well as education for the patient.  Jesaiah Fabiano D. Shadeed Colberg, PharmD, BCPS Clinical Pharmacist Pager: [phone] 06/22/2014 8:55 AM   ADDENDUM Patient is going to transition to Pradaxa for PE/DVT treatment. Spoke with Dr. Cena BentonVega. Ok to start as patient has been on heparin for 5 days (today is D#5).  Pradaxa dosing  is 150mg  PO BID.  Plan: -continue heparin until 1500 tonight. Then turn off.  -given Pradaxa 150mg  PO x1 tonight at 1600 (1hr after heparin is stopped) -will counsel patient on Pradaxa  Zackery Brine D. Cavan Bearden, PharmD, BCPS Clinical Pharmacist Pager: [phone] 06/22/2014 10:56 AM

## 2014-06-22 NOTE — Discharge Summary (Addendum)
Physician Discharge Summary  Brian Clark:[id number] DOB: 1945-04-09 DOA: 06/17/2014  PCP: No primary care provider on file.  Admit date: 06/17/2014 Discharge date: 06/22/2014  Time spent: > 35 minutes  Recommendations for Outpatient Follow-up:  1. Patient will be discharged on Pradaxa. Discussed different options and patient wanted to avoid Coumadin 2. Addendum: Patient will be discharged with 10 day medication supply. [facility] medication past the 10 days  Discharge Diagnoses:  Active Problems:   Pulmonary embolism   DVT of popliteal vein   HTN (hypertension)   COPD (chronic obstructive pulmonary disease)   BPH (benign prostatic hyperplasia)   History of tobacco abuse   Alcohol abuse   Discharge Condition: Stable  Diet recommendation: Low sodium  Filed Weights   06/18/14 0500 06/19/14 0500 06/20/14 0400  Weight: 87 kg (191 lb 12.8 oz) 84.9 kg (187 lb 2.7 oz) 85.6 kg (188 lb 11.4 oz)    History of present illness:  36 M with weeks-months progressive SOB and fatigue. Hx concerning for excessive EtOH intake. Presented to [facility] and found to have submassive PEs. Was brought to [facility] for further evaluation of clot burden to see whether patient was candidate for procedure reducing clot burden.  Hospital Course:  Pulmonary embolism - Not EKOS candidate - Currently patient deciding which anticoagulant to go home on. I have presented options. He prefers to avoid Coumadin. Case manager consulted for assistance in medication pricing options. Patient has chosen pradaxa - Was on heparin for 5 days will be transitioned to Pradaxa on discharge   DVT of popliteal vein - Was on heparin for 5 days with a transition to Pradaxa on d/c   HTN (hypertension) - We'll controlled currently on amlodipine will continue on DC   COPD (chronic obstructive pulmonary disease) - Compensated currently no wheezes on exam - Discontinue Brovana and Pulmicort as  patient will be started on Symbicort on d/c   BPH (benign prostatic hyperplasia) - Stable on continue home medication regimen  Procedures:  IR angiogram  Consultations:  IR  Discharge Exam: Filed Vitals:   06/22/14 0940  BP: 123/81  Pulse:   Temp:   Resp:     General: Pt in nad, alert and awake Cardiovascular: rrr, no mrg Respiratory: cta bl, no wheezes  Discharge Instructions   Discharge Instructions    Call MD for:  difficulty breathing, headache or visual disturbances    Complete by:  As directed      Call MD for:  severe uncontrolled pain    Complete by:  As directed      Call MD for:  temperature >100.4    Complete by:  As directed      Diet - low sodium heart healthy    Complete by:  As directed      Increase activity slowly    Complete by:  As directed           Current Discharge Medication List    START taking these medications   Details  budesonide-formoterol (SYMBICORT) 80-4.5 MCG/ACT inhaler Inhale 2 puffs into the lungs 2 (two) times daily. Qty: 1 Inhaler, Refills: 0    dabigatran (PRADAXA) 150 MG CAPS capsule Take 1 capsule (150 mg total) by mouth 2 (two) times daily. Qty: 10 capsule, Refills: 0      CONTINUE these medications which have NOT CHANGED   Details  albuterol (PROVENTIL HFA;VENTOLIN HFA) 108 (90 BASE) MCG/ACT inhaler Inhale 2 puffs into the lungs 4 (  four) times daily as needed for wheezing or shortness of breath.     amLODipine (NORVASC) 10 MG tablet Take 2.5 mg by mouth 2 (two) times daily as needed (blood pressure >120/80). 1/4 tablet    aspirin EC 81 MG tablet Take 81 mg by mouth daily.    Cholecalciferol (VITAMIN D3) 2000 UNITS TABS Take 2,000 Units by mouth daily.    finasteride (PROSCAR) 5 MG tablet Take 5 mg by mouth at bedtime.    guaifenesin (HUMIBID E) 400 MG TABS tablet Take 1,200 mg by mouth 2 (two) times daily.    Misc Natural Products (TURMERIC CURCUMIN) CAPS Take 1 capsule by mouth 3 (three) times a week.  Monday, Wednesday, Friday (750 mg each)    montelukast (SINGULAIR) 10 MG tablet Take 10 mg by mouth daily.    Multiple Vitamin (MULTIVITAMIN WITH MINERALS) TABS tablet Take 1 tablet by mouth daily. Senior Vitamin for Men    nicotine polacrilex (NICORETTE) 4 MG gum Take 4 mg by mouth every hour as needed for smoking cessation.    Omega-3 Krill Oil 500 MG CAPS Take 500 mg by mouth 4 (four) times a week. Sunday, Tuesday, Thursday, Saturday    omeprazole (PRILOSEC) 20 MG capsule Take 20 mg by mouth daily before breakfast.    sildenafil (VIAGRA) 100 MG tablet Take 50 mg by mouth daily as needed for erectile dysfunction.    sodium chloride (OCEAN) 0.65 % SOLN nasal spray Place 1 spray into both nostrils 3 (three) times daily as needed for congestion.    tamsulosin (FLOMAX) 0.4 MG CAPS capsule Take 0.4 mg by mouth at bedtime.    tiotropium (SPIRIVA) 18 MCG inhalation capsule Place 18 mcg into inhaler and inhale daily.    Zinc 25 MG TABS Take 25 mg by mouth 4 (four) times a week. Sunday, Tuesday, Thursday, Saturday      STOP taking these medications     albuterol (PROVENTIL) (2.5 MG/3ML) 0.083% nebulizer solution      arformoterol (BROVANA) 15 MCG/2ML NEBU      budesonide (PULMICORT) 0.5 MG/2ML nebulizer solution      Cyanocobalamin (VITAMIN B-12) 5000 MCG SUBL      lisinopril (PRINIVIL,ZESTRIL) 40 MG tablet        Allergies  Allergen Reactions  . Other Other (See Comments)    Allergic to dogs, cats, maple trees, elm trees, black mold per allergy test  . Tetanus Toxoids Swelling and Rash   Follow-up Information    Follow [facility].   Specialty:  Home Health Services   Why:  hhpt   Contact information:   [address] [place] [id number] [phone]        The results of significant diagnostics from this hospitalization (including imaging, microbiology, ancillary and laboratory) are listed below for reference.    Significant Diagnostic  Studies: Ir Angiogram Pulmonary Bilateral Selective  06/19/2014   CLINICAL DATA:  Pulmonary thromboembolism.  Right heart strain.  EXAM: BILATERAL PULMONARY ARTERIOGRAPHY; IR ULTRASOUND GUIDANCE VASC ACCESS RIGHT  FLUOROSCOPY TIME:  One minutes  MEDICATIONS AND MEDICAL HISTORY: Versed 1 mg, Fentanyl 50 mcg.  Additional Medications: None.  ANESTHESIA/SEDATION: Moderate sedation time: 20 minutes  CONTRAST:  20 cc Omnipaque 300  PROCEDURE: The procedure, risks, benefits, and alternatives were explained to the patient. Questions regarding the procedure were encouraged and answered. The patient understands and consents to the procedure.  The right groin was prepped with Betadine in a sterile fashion, and a sterile  drape was applied covering the operative field. A sterile gown and sterile gloves were used for the procedure.  Under sonographic guidance, a micropuncture needle was inserted into the right common femoral vein and removed over a 018 wire which was up sized to a Bentson. A 7 French sheath was inserted. The identical procedure was performed in the same vein slightly medial to the regional sheath.  A JB 1 catheter was advanced over the Bentson wire into the right atrium. It was then advanced over a glidewire into the left pulmonary artery. Contrast injection for angiography was performed. Left pulmonary artery pressure was performed yielding 34/10 with a mean of 22 mm Hg. The identical procedure was performed in the right pulmonary artery yielding an identical pressure. Catheters were removed. Sheath was removed and hemostasis was achieved with VPAD.  FINDINGS: Left pulmonary artery angiography demonstrates catheter position in the main left pulmonary artery. No obvious filling defects are present although sensitivity is diminished compare with CT. Right pulmonary artery angiography confirms moderate clot burden in the descending branch of the right pulmonary artery.  COMPLICATIONS: None  IMPRESSION: Pulmonary  arteriography and pressure measurements were obtained. Pressure measurements are only slightly elevated with a peak systolic pressure of 35 mm Hg. The patient is relatively asymptomatic and the troponin level was within normal limits. There are no obvious central pulmonary artery filling defects. Pulmonary artery thrombo lytic therapy was deferred at this time.   Electronically Signed   By: Jolaine Click M.D.   On: 06/19/2014 14:23   Ir US Guide Vasc Access Right  06/19/2014   CLINICAL DATA:  Pulmonary thromboembolism.  Right heart strain.  EXAM: BILATERAL PULMONARY ARTERIOGRAPHY; IR ULTRASOUND GUIDANCE VASC ACCESS RIGHT  FLUOROSCOPY TIME:  One minutes  MEDICATIONS AND MEDICAL HISTORY: Versed 1 mg, Fentanyl 50 mcg.  Additional Medications: None.  ANESTHESIA/SEDATION: Moderate sedation time: 20 minutes  CONTRAST:  20 cc Omnipaque 300  PROCEDURE: The procedure, risks, benefits, and alternatives were explained to the patient. Questions regarding the procedure were encouraged and answered. The patient understands and consents to the procedure.  The right groin was prepped with Betadine in a sterile fashion, and a sterile drape was applied covering the operative field. A sterile gown and sterile gloves were used for the procedure.  Under sonographic guidance, a micropuncture needle was inserted into the right common femoral vein and removed over a 018 wire which was up sized to a Bentson. A 7 French sheath was inserted. The identical procedure was performed in the same vein slightly medial to the regional sheath.  A JB 1 catheter was advanced over the Bentson wire into the right atrium. It was then advanced over a glidewire into the left pulmonary artery. Contrast injection for angiography was performed. Left pulmonary artery pressure was performed yielding 34/10 with a mean of 22 mm Hg. The identical procedure was performed in the right pulmonary artery yielding an identical pressure. Catheters were removed. Sheath  was removed and hemostasis was achieved with VPAD.  FINDINGS: Left pulmonary artery angiography demonstrates catheter position in the main left pulmonary artery. No obvious filling defects are present although sensitivity is diminished compare with CT. Right pulmonary artery angiography confirms moderate clot burden in the descending branch of the right pulmonary artery.  COMPLICATIONS: None  IMPRESSION: Pulmonary arteriography and pressure measurements were obtained. Pressure measurements are only slightly elevated with a peak systolic pressure of 35 mm Hg. The patient is relatively asymptomatic and the troponin level was within normal limits.  There are no obvious central pulmonary artery filling defects. Pulmonary artery thrombo lytic therapy was deferred at this time.   Electronically Signed   By: Jolaine Click M.D.   On: 06/19/2014 14:23   Dg Chest Port 1 View  06/18/2014   CLINICAL DATA:  Pulmonary embolism.  EXAM: PORTABLE CHEST - 1 VIEW  COMPARISON:  06/17/2014  FINDINGS: Lungs are adequately inflated without consolidation or effusion. Mild emphysematous disease. Cardiomediastinal silhouette and remainder of the exam is unchanged.  IMPRESSION: No active disease.   Electronically Signed   By: Elberta Fortis M.D.   On: 06/18/2014 07:41    Microbiology: Recent Results (from the past 240 hour(s))  MRSA PCR Screening     Status: None   Collection Time: 06/17/14  6:18 PM  Result Value Ref Range Status   MRSA by PCR NEGATIVE NEGATIVE Final    Comment:        The GeneXpert MRSA Assay (FDA approved for NASAL specimens only), is one component of a comprehensive MRSA colonization surveillance program. It is not intended to diagnose MRSA infection nor to guide or monitor treatment for MRSA infections.      Labs: Basic Metabolic Panel:  Recent Labs Lab 06/18/14 0059 06/19/14 0035  NA 134* 137  K 4.0 4.0  CL 99 102  CO2 21 29  GLUCOSE 167* 108*  BUN 8 13  CREATININE 0.71 0.86  CALCIUM  8.8 8.5   Liver Function Tests: No results for input(s): AST, ALT, ALKPHOS, BILITOT, PROT, ALBUMIN in the last 168 hours. No results for input(s): LIPASE, AMYLASE in the last 168 hours. No results for input(s): AMMONIA in the last 168 hours. CBC:  Recent Labs Lab 06/18/14 0951 06/19/14 0035 06/20/14 0519 06/21/14 0839 06/22/14 0620  WBC 8.5 9.1 6.3 5.9 5.6  HGB 15.0 14.3 15.2 15.7 15.3  HCT 44.3 42.8 46.5 46.1 46.7  MCV 94.1 95.3 97.5 96.0 96.5  PLT 192 168 145* 154 151   Cardiac Enzymes:  Recent Labs Lab 06/17/14 2050 06/18/14 0059 06/18/14 0951  TROPONINI <0.03 <0.03 <0.03   BNP: BNP (last 3 results)  Recent Labs  06/17/14 2050  BNP 52.0    ProBNP (last 3 results) No results for input(s): PROBNP in the last 8760 hours.  CBG:  Recent Labs Lab 06/17/14 1832 06/17/14 2151 06/18/14 0013 06/18/14 0417  GLUCAP 169* 152* 159* 181*       Signed:  Penny Pia  [facility] 06/22/2014, 11:10 AM

## 2014-06-22 NOTE — Progress Notes (Signed)
Patient with wife at bedside given discharge instructions, and prescriptions given. All questions answered understanding verbalized. Follow-up with home health in place.

## 2014-06-22 NOTE — Discharge Instructions (Signed)
Information on my medicine - Pradaxa (dabigatran)  This medication education was reviewed with me or my healthcare representative as part of my discharge preparation.  The pharmacist that spoke with me during my hospital stay was:  Calyx Hawker, RPH  Why was Pradaxa prescribed for you? Pradaxa was prescribed to treat blood clots that may have been found in the veins of your legs (deep vein thrombosis) or in your lungs (pulmonary embolism) and to reduce the risk of them occurring again.  Pradaxa will take the place of the injectable anticoagulant medication you have been receiving for this condition for the last 5-10 days.  What do you Need to know about PradAXa? Take your Pradaxa TWICE DAILY - one capsule in the morning and one tablet in the evening with or without food.  It would be best to take the doses about the same time each day.  The capsules should not be broken, chewed or opened - they must be swallowed whole.  Do not store Pradaxa in other medication containers - once the bottle is opened the Pradaxa should be used within FOUR months; throw away any capsules that havent been by that time.  Take Pradaxa exactly as prescribed by your doctor.  DO NOT stop taking Pradaxa without talking to the doctor who prescribed the medication.  Refill your prescription before you run out.  After discharge, you should have regular check-up appointments with your healthcare provider that is prescribing your Pradaxa.  In the future your dose may need to be changed if your kidney function or weight changes by a significant amount.  What do you do if you miss a dose? If you miss a dose, take it as soon as you remember on the same day.  If your next dose is less than 6 hours away, skip the missed dose.  Do not take two doses of PRADAXA at the same time.  Important Safety Information A possible side effect of Pradaxa is bleeding. You should call your healthcare provider right away if you  experience any of the following: ? Bleeding from an injury or your nose that does not stop. ? Unusual colored urine (red or dark brown) or unusual colored stools (red or black). ? Unusual bruising for unknown reasons. ? A serious fall or if you hit your head (even if there is no bleeding).  Some medicines may interact with Pradaxa and might increase your risk of bleeding or clotting while on Pradaxa. To help avoid this, consult your healthcare provider or pharmacist prior to using any new prescription or non-prescription medications, including herbals, vitamins, non-steroidal anti-inflammatory drugs (NSAIDs) and supplements.  This website has more information on Pradaxa (dabigatran): [email or web address].

## 2014-06-22 NOTE — Progress Notes (Signed)
OT Cancellation Note  Patient Details Name: Brian DrumHarry Clark MRN: [id number] DOB: December 30, 1945   Cancelled Treatment:    Reason Eval/Treat Not Completed: Patient declined, no reason specified. Pt going home today and d/c therapy, states "I don't want to get worn out before I go home."  Rae LipsMiller, Shyia Fillingim M 06/22/2014, 11:26 AM

## 2017-08-01 ENCOUNTER — Ambulatory Visit (INDEPENDENT_AMBULATORY_CARE_PROVIDER_SITE_OTHER): Payer: No Typology Code available for payment source | Admitting: Pulmonary Disease

## 2017-08-01 ENCOUNTER — Encounter: Payer: Self-pay | Admitting: Pulmonary Disease

## 2017-08-01 VITALS — BP 140/86 | HR 83 | Ht 72.0 in | Wt 170.0 lb

## 2017-08-01 DIAGNOSIS — J9611 Chronic respiratory failure with hypoxia: Secondary | ICD-10-CM

## 2017-08-01 DIAGNOSIS — I2609 Other pulmonary embolism with acute cor pulmonale: Secondary | ICD-10-CM

## 2017-08-01 DIAGNOSIS — I2782 Chronic pulmonary embolism: Secondary | ICD-10-CM

## 2017-08-01 DIAGNOSIS — J42 Unspecified chronic bronchitis: Secondary | ICD-10-CM

## 2017-08-01 NOTE — Progress Notes (Signed)
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  He   Subjective:   PATIENT ID: Brian Clark GENDER: male DOB: [birth year], MRN: [id number]   HPI  Chief Complaint  Patient presents with  . new consult    COPD   Mr. Granier is here to see me for and establish care for COPD. > in 2000 he had severe dyspnea and had a lot of chest congestion and mucus production > he says that he had a pseudomonas infection  > he uses brovana/pulmicort, spiriva regularly > he uses albuterol nebulized twice per day > he was recently treated for 2 exacerbations of COPD in 2019  No changes to the home envinronment.  Quit smoking cigarettes in 2000 after smoking 47 years.  He smoked Marlboro reds 2 packs per day.   Recently he had a DVT in his right leg and ended up having PE's.  > he took Cipro for a UTI and ended up with severe tendonitis in his R achilles and his walking was limited > he says that one morening he had bad pain in his chest that moved around in his chest that gave him a heart attack > he was hospitalized and did not receive thrombolysis  Past Medical History:  Diagnosis Date  . Anemia   . Arthritis    "numerous joints" (06/20/2014)  . Asthma   . Chronic back pain    "w/standing; not as bad when I'm walking"  . Chronic bronchitis (HCC)    "I keep it"  . COPD (chronic obstructive pulmonary disease) (HCC)   . Cyst of right kidney   . DVT (deep venous thrombosis) (HCC) 06/19/2014   right popliteal vein Hattie Perch 06/20/2014  . Hepatitis 1962   "don't know which one"  . History of blood transfusion "I've had many"   "related to ORs"  . History of duodenal ulcer   . History of stomach ulcers   . Hypertension   . On home oxygen therapy    "3L when I sleep; 4L when I'm active" (06/20/2014)  . Positive TB test   . Pulmonary embolism (HCC)    hospitalized 06/19/2014  . Spondylosis    "C1-L5"  (06/20/2014)     No family history on file.   Social History   Socioeconomic History  . Marital status: Married    Spouse name: Not on file  . Number of children: Not on file  . Years of education: Not on file  . Highest education level: Not on file  Occupational History  . Not on file  Social Needs  . Financial resource strain: Not on file  . Food insecurity:    Worry: Not on file    Inability: Not on file  . Transportation needs:    Medical: Not on file    Non-medical: Not on file  Tobacco Use  . Smoking status: Former Smoker    Packs/day: 2.00    Years: 47.00    Pack years: 94.00    Types: Cigarettes    Last attempt to quit: 08/06/1999    Years since quitting: 18.0  . Smokeless tobacco: Former Neurosurgeon    Types: Chew    Quit date: 08/06/1999  . Tobacco comment: 06/20/2014 "chew nicotine gum"  Substance and Sexual Activity  . Alcohol use: Yes    Alcohol/week: 25.2 oz    Types: 42 Cans of beer per week  Comment: 06/20/2014 "4-6 beers/day; drink more when I'm watching ballgame"  . Drug use: No  . Sexual activity: Never  Lifestyle  . Physical activity:    Days per week: Not on file    Minutes per session: Not on file  . Stress: Not on file  Relationships  . Social connections:    Talks on phone: Not on file    Gets together: Not on file    Attends religious service: Not on file    Active member of club or organization: Not on file    Attends meetings of clubs or organizations: Not on file    Relationship status: Not on file  . Intimate partner violence:    Fear of current or ex partner: Not on file    Emotionally abused: Not on file    Physically abused: Not on file    Forced sexual activity: Not on file  Other Topics Concern  . Not on file  Social History Narrative  . Not on file     Allergies  Allergen Reactions  . Ciprofloxacin Other (See Comments)    CAUSED BLOOD CLOTS.   Candyce Churn Flavor Other (See Comments)    SHOWED ON ALLERGY TESTING.   . Other  Other (See Comments)    Allergic to dogs, cats, maple trees, elm trees, black mold per allergy test  . Tetanus Toxoids Swelling and Rash     Outpatient Medications Prior to Visit  Medication Sig Dispense Refill  . albuterol (PROVENTIL HFA;VENTOLIN HFA) 108 (90 BASE) MCG/ACT inhaler Inhale 2 puffs into the lungs 4 (four) times daily as needed for wheezing or shortness of breath.     Marland Kitchen albuterol (PROVENTIL) (2.5 MG/3ML) 0.083% nebulizer solution Take 2.5 mg by nebulization every 6 (six) hours as needed for wheezing or shortness of breath.    . ALPRAZolam (XANAX) 0.5 MG tablet Take 0.5 mg by mouth daily as needed for anxiety.    Marland Kitchen arformoterol (BROVANA) 15 MCG/2ML NEBU Take 15 mcg by nebulization 2 (two) times daily.    Marland Kitchen aspirin EC 81 MG tablet Take 81 mg by mouth daily.    . benzonatate (TESSALON) 100 MG capsule Take 100 mg by mouth 2 (two) times daily.    . budesonide (PULMICORT) 0.5 MG/2ML nebulizer solution Take 0.5 mg by nebulization 2 (two) times daily.    . budesonide-formoterol (SYMBICORT) 80-4.5 MCG/ACT inhaler Inhale 2 puffs into the lungs 2 (two) times daily. 1 Inhaler 0  . Cholecalciferol (VITAMIN D3) 2000 UNITS TABS Take 2,000 Units by mouth daily.    . cycloSPORINE (RESTASIS) 0.05 % ophthalmic emulsion 1 drop 2 (two) times daily.    . dabigatran (PRADAXA) 150 MG CAPS capsule Take 1 capsule (150 mg total) by mouth 2 (two) times daily. 60 capsule 0  . escitalopram (LEXAPRO) 10 MG tablet Take 10 mg by mouth daily.    Marland Kitchen guaifenesin (HUMIBID E) 400 MG TABS tablet Take 1,200 mg by mouth 2 (two) times daily.    Marland Kitchen losartan (COZAAR) 50 MG tablet Take 50 mg by mouth daily.    . Misc Natural Products (TURMERIC CURCUMIN) CAPS Take 1 capsule by mouth 3 (three) times a week. Monday, Wednesday, Friday (750 mg each)    . montelukast (SINGULAIR) 10 MG tablet Take 10 mg by mouth daily.    . Multiple Vitamin (MULTIVITAMIN WITH MINERALS) TABS tablet Take 1 tablet by mouth daily. Senior Vitamin for Men     . nicotine polacrilex (NICORETTE) 4 MG gum Take  4 mg by mouth every hour as needed for smoking cessation.    . Omega-3 Krill Oil 500 MG CAPS Take 500 mg by mouth 4 (four) times a week. Sunday, Tuesday, Thursday, Saturday    . omeprazole (PRILOSEC) 20 MG capsule Take 20 mg by mouth daily before breakfast.    . oxybutynin (DITROPAN-XL) 10 MG 24 hr tablet Take 10 mg by mouth at bedtime.    . sildenafil (VIAGRA) 100 MG tablet Take 50 mg by mouth daily as needed for erectile dysfunction.    . simethicone (MYLICON) 80 MG chewable tablet Chew 80 mg by mouth every 6 (six) hours as needed for flatulence.    . sodium chloride (OCEAN) 0.65 % SOLN nasal spray Place 1 spray into both nostrils 3 (three) times daily as needed for congestion.    . tamsulosin (FLOMAX) 0.4 MG CAPS capsule Take 0.4 mg by mouth at bedtime.    . Tiotropium Bromide Monohydrate (SPIRIVA RESPIMAT) 2.5 MCG/ACT AERS Inhale 2 puffs into the lungs daily.    . Zinc 25 MG TABS Take 25 mg by mouth 4 (four) times a week. Sunday, Tuesday, Thursday, Saturday    . amLODipine (NORVASC) 10 MG tablet Take 2.5 mg by mouth 2 (two) times daily as needed (blood pressure >120/80). 1/4 tablet    . finasteride (PROSCAR) 5 MG tablet Take 5 mg by mouth at bedtime.    Marland Kitchen tiotropium (SPIRIVA) 18 MCG inhalation capsule Place 18 mcg into inhaler and inhale daily.     No facility-administered medications prior to visit.     Review of Systems  Constitutional: Negative for chills, fever, malaise/fatigue and weight loss.  HENT: Negative for congestion, nosebleeds, sinus pain and sore throat.   Eyes: Negative for photophobia, pain and discharge.  Respiratory: Positive for cough, sputum production, shortness of breath and wheezing. Negative for hemoptysis.   Cardiovascular: Negative for chest pain, palpitations, orthopnea and leg swelling.  Gastrointestinal: Negative for abdominal pain, constipation, diarrhea, nausea and vomiting.  Genitourinary: Negative for  dysuria, frequency, hematuria and urgency.  Musculoskeletal: Negative for back pain, joint pain, myalgias and neck pain.  Skin: Negative for itching and rash.  Neurological: Negative for tingling, tremors, sensory change, speech change, focal weakness, seizures, weakness and headaches.  Psychiatric/Behavioral: Negative for memory loss, substance abuse and suicidal ideas. The patient is not nervous/anxious.       Objective:  Physical Exam   Vitals:   08/01/17 0953  BP: 140/86  Pulse: 83  SpO2: 94%  Weight: 170 lb (77.1 kg)  Height: 6' (1.829 m)   4L [place]  Gen: chronically ill appearing, no acute distress HENT: NCAT, OP clear, neck supple without masses Eyes: PERRL, EOMi Lymph: no cervical lymphadenopathy PULM: Poor air movement, some rhonchi CV: RRR, no mgr, no JVD GI: BS+, soft, nontender, no hsm Derm: no rash or skin breakdown MSK: normal bulk and tone Neuro: A&Ox4, CN II-XII intact, strength 5/5 in all 4 extremities Psyche: normal mood and affect   CBC    Component Value Date/Time   WBC 5.6 06/22/2014 0620   RBC 4.84 06/22/2014 0620   HGB 15.3 06/22/2014 0620   HCT 46.7 06/22/2014 0620   PLT 151 06/22/2014 0620   MCV 96.5 06/22/2014 0620   MCH 31.6 06/22/2014 0620   MCHC 32.8 06/22/2014 0620   RDW 12.5 06/22/2014 0620     Chest imaging: March 2019 chest x-ray report from the [place] shows severe emphysema changes. 2016 CT chest images reviewed showing moderate to  severe emphysema  PFT:  Labs:  Micro: History of sputum culture with pseudomonas in 2016  Path:  Echo:  Heart Catheterization:  Hospitalization records reviewed where he was seen for pulmonary embolism in 06/2017.  D/C'd home on Pradaxa, did not have thrombolysis.  Records from the [place] provided, he takes albuterol as needed, Pulmicort twice a day, Singulair and omeprazole.  He noted having some trouble breathing with normal oxygen values.  The primary care notes indicate that he received the  Prevnar vaccine in 2015 and Pneumovax in 2016, he uses 3 L of oxygen at rest and at sleep and 4-5 with exertion  Ambulatory oximetry assessment: March 2018 by the [facility] room air at rest 94%, room air on exertion 82% after 350 feet.  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 Outten has severe COPD with recurrent exacerbations.  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  I think the best approach moving forward is going to be the addition of daily azithromycin which apparently was the thought process of his last pulmonologist prior to retirement.  Right now I cannot get any blood work or culture results because he still on a prednisone and azithromycin round of treatment for his most recent exacerbation of COPD.  His recurrent exacerbations are associated with a poor prognosis.  He is very compliant with his controller medicines as well as his oxygen.  I told him today that I thought that he could either stop or cut the dose of Pradaxa in half.  He is reluctant to do this.  Plan: Acute exacerbation of COPD: Continue the prednisone and azithromycin you are taking right now  Severe COPD: Continue Brovana and Pulmicort twice a day Continue Spiriva Continue albuterol twice a day and as needed In 3 weeks come back for a blood test and provide [place] with a sputum culture Please forward the results of your most recent stress test and pulmonary function test to our  office  Chronic respiratory failure with hypoxemia: Continue 3 L of oxygen at rest, 4-5 with exertion, 3 L with sleep  Pulmonary embolism: On the next visit we will discuss whether or not we should reduce the dose of Pradaxa to 75 mg twice a day  Follow up in 4 weeks or sooner if needed    Current Outpatient Medications:  .  albuterol (PROVENTIL HFA;VENTOLIN HFA) 108 (90 BASE) MCG/ACT inhaler, Inhale 2 puffs into the lungs 4 (four) times daily as needed for wheezing or shortness of breath. , Disp: , Rfl:  .  albuterol (PROVENTIL) (2.5 MG/3ML) 0.083% nebulizer solution, Take 2.5 mg by nebulization every 6 (six) hours as needed for wheezing or shortness of breath., Disp: , Rfl:  .  ALPRAZolam (XANAX) 0.5 MG tablet, Take 0.5 mg by mouth daily as needed for anxiety., Disp: , Rfl:  .  arformoterol (BROVANA) 15 MCG/2ML NEBU, Take 15 mcg by nebulization 2 (two) times daily., Disp: , Rfl:  .  aspirin EC  81 MG tablet, Take 81 mg by mouth daily., Disp: , Rfl:  .  benzonatate (TESSALON) 100 MG capsule, Take 100 mg by mouth 2 (two) times daily., Disp: , Rfl:  .  budesonide (PULMICORT) 0.5 MG/2ML nebulizer solution, Take 0.5 mg by nebulization 2 (two) times daily., Disp: , Rfl:  .  budesonide-formoterol (SYMBICORT) 80-4.5 MCG/ACT inhaler, Inhale 2 puffs into the lungs 2 (two) times daily., Disp: 1 Inhaler, Rfl: 0 .  Cholecalciferol (VITAMIN D3) 2000 UNITS TABS, Take 2,000 Units by mouth daily., Disp: , Rfl:  .  cycloSPORINE (RESTASIS) 0.05 % ophthalmic emulsion, 1 drop 2 (two) times daily., Disp: , Rfl:  .  dabigatran (PRADAXA) 150 MG CAPS capsule, Take 1 capsule (150 mg total) by mouth 2 (two) times daily., Disp: 60 capsule, Rfl: 0 .  escitalopram (LEXAPRO) 10 MG tablet, Take 10 mg by mouth daily., Disp: , Rfl:  .  guaifenesin (HUMIBID E) 400 MG TABS tablet, Take 1,200 mg by mouth 2 (two) times daily., Disp: , Rfl:  .  losartan (COZAAR) 50 MG tablet, Take 50 mg by mouth daily., Disp: , Rfl:  .  Misc  Natural Products (TURMERIC CURCUMIN) CAPS, Take 1 capsule by mouth 3 (three) times a week. Monday, Wednesday, Friday (750 mg each), Disp: , Rfl:  .  montelukast (SINGULAIR) 10 MG tablet, Take 10 mg by mouth daily., Disp: , Rfl:  .  Multiple Vitamin (MULTIVITAMIN WITH MINERALS) TABS tablet, Take 1 tablet by mouth daily. Senior Vitamin for Men, Disp: , Rfl:  .  nicotine polacrilex (NICORETTE) 4 MG gum, Take 4 mg by mouth every hour as needed for smoking cessation., Disp: , Rfl:  .  Omega-3 Krill Oil 500 MG CAPS, Take 500 mg by mouth 4 (four) times a week. Sunday, Tuesday, Thursday, Saturday, Disp: , Rfl:  .  omeprazole (PRILOSEC) 20 MG capsule, Take 20 mg by mouth daily before breakfast., Disp: , Rfl:  .  oxybutynin (DITROPAN-XL) 10 MG 24 hr tablet, Take 10 mg by mouth at bedtime., Disp: , Rfl:  .  sildenafil (VIAGRA) 100 MG tablet, Take 50 mg by mouth daily as needed for erectile dysfunction., Disp: , Rfl:  .  simethicone (MYLICON) 80 MG chewable tablet, Chew 80 mg by mouth every 6 (six) hours as needed for flatulence., Disp: , Rfl:  .  sodium chloride (OCEAN) 0.65 % SOLN nasal spray, Place 1 spray into both nostrils 3 (three) times daily as needed for congestion., Disp: , Rfl:  .  tamsulosin (FLOMAX) 0.4 MG CAPS capsule, Take 0.4 mg by mouth at bedtime., Disp: , Rfl:  .  Tiotropium Bromide Monohydrate (SPIRIVA RESPIMAT) 2.5 MCG/ACT AERS, Inhale 2 puffs into the lungs daily., Disp: , Rfl:  .  Zinc 25 MG TABS, Take 25 mg by mouth 4 (four) times a week. Sunday, Tuesday, Thursday, Saturday, Disp: , Rfl:  .  amLODipine (NORVASC) 10 MG tablet, Take 2.5 mg by mouth 2 (two) times daily as needed (blood pressure >120/80). 1/4 tablet, Disp: , Rfl:  .  finasteride (PROSCAR) 5 MG tablet, Take 5 mg by mouth at bedtime., Disp: , Rfl:  .  tiotropium (SPIRIVA) 18 MCG inhalation capsule, Place 18 mcg into inhaler and inhale daily., Disp: , Rfl:

## 2017-08-01 NOTE — Patient Instructions (Signed)
Acute exacerbation of COPD: Continue the prednisone and azithromycin you are taking right now  Severe COPD: Continue Brovana and Pulmicort twice a day Continue Spiriva Continue albuterol twice a day and as needed In 3 weeks come back for a blood test and provide [place] with a sputum culture  Chronic respiratory failure with hypoxemia: Continue 3 L of oxygen at rest, 4-5 with exertion, 3 L with sleep  Pulmonary embolism: On the next visit we will discuss whether or not we should reduce the dose of Pradaxa to 75 mg twice a day  Follow up in 4 weeks or sooner if needed

## 2017-09-05 ENCOUNTER — Encounter: Payer: Self-pay | Admitting: Pulmonary Disease

## 2017-09-17 ENCOUNTER — Ambulatory Visit: Payer: Non-veteran care | Admitting: Pulmonary Disease

## 2017-09-30 ENCOUNTER — Telehealth: Payer: Self-pay | Admitting: Pulmonary Disease

## 2017-09-30 ENCOUNTER — Other Ambulatory Visit (INDEPENDENT_AMBULATORY_CARE_PROVIDER_SITE_OTHER): Payer: No Typology Code available for payment source

## 2017-09-30 DIAGNOSIS — J42 Unspecified chronic bronchitis: Secondary | ICD-10-CM

## 2017-09-30 LAB — CBC WITH DIFFERENTIAL/PLATELET
BASOS ABS: 0 10*3/uL (ref 0.0–0.1)
Basophils Relative: 0.5 % (ref 0.0–3.0)
Eosinophils Absolute: 0.3 10*3/uL (ref 0.0–0.7)
Eosinophils Relative: 4.5 % (ref 0.0–5.0)
HEMATOCRIT: 43.3 % (ref 39.0–52.0)
HEMOGLOBIN: 14.8 g/dL (ref 13.0–17.0)
LYMPHS PCT: 14.2 % (ref 12.0–46.0)
Lymphs Abs: 0.8 10*3/uL (ref 0.7–4.0)
MCHC: 34.1 g/dL (ref 30.0–36.0)
MCV: 93.5 fl (ref 78.0–100.0)
MONOS PCT: 7.7 % (ref 3.0–12.0)
Monocytes Absolute: 0.5 10*3/uL (ref 0.1–1.0)
NEUTROS ABS: 4.3 10*3/uL (ref 1.4–7.7)
Neutrophils Relative %: 73.1 % (ref 43.0–77.0)
PLATELETS: 158 10*3/uL (ref 150.0–400.0)
RBC: 4.63 Mil/uL (ref 4.22–5.81)
RDW: 13.2 % (ref 11.5–15.5)
WBC: 5.9 10*3/uL (ref 4.0–10.5)

## 2017-09-30 NOTE — Telephone Encounter (Signed)
Papers and disk have been placed in BQ's cubby for review.  BQ please advise.  Thanks!

## 2017-10-02 NOTE — Telephone Encounter (Signed)
Following up with BQ to see if he has been able to view the papers and disk for this pt.  BQ please advise

## 2017-10-03 NOTE — Telephone Encounter (Signed)
Folder returned this morning

## 2017-10-06 NOTE — Telephone Encounter (Signed)
Called and spoke to patient's wife and let her know that Dr. Kendrick FriesMcQuaid had reviewed the disk so they are able to pick it up. She stated they will pick it up at patient's next appointment on 10/31/17. Nothing further needed.

## 2017-10-06 NOTE — Telephone Encounter (Signed)
Routing message to Brian SeashoreBettie Jo to see if she has the folder for this pt. Nothing further needed at this time as BQ has reviewed folder for pt.

## 2017-10-31 ENCOUNTER — Encounter: Payer: Self-pay | Admitting: Pulmonary Disease

## 2017-10-31 ENCOUNTER — Ambulatory Visit (INDEPENDENT_AMBULATORY_CARE_PROVIDER_SITE_OTHER): Payer: No Typology Code available for payment source | Admitting: Pulmonary Disease

## 2017-10-31 VITALS — BP 118/80 | HR 100 | Ht 72.05 in | Wt 175.0 lb

## 2017-10-31 DIAGNOSIS — I2782 Chronic pulmonary embolism: Secondary | ICD-10-CM | POA: Diagnosis not present

## 2017-10-31 DIAGNOSIS — I2609 Other pulmonary embolism with acute cor pulmonale: Secondary | ICD-10-CM | POA: Diagnosis not present

## 2017-10-31 DIAGNOSIS — J9611 Chronic respiratory failure with hypoxia: Secondary | ICD-10-CM | POA: Diagnosis not present

## 2017-10-31 DIAGNOSIS — J42 Unspecified chronic bronchitis: Secondary | ICD-10-CM

## 2017-10-31 MED ORDER — AZITHROMYCIN 250 MG PO TABS: 250.0000 mg | ORAL_TABLET | Freq: Every day | ORAL | 3 refills | Status: AC

## 2017-10-31 MED ORDER — PREDNISONE 5 MG PO TABS: 5.0000 mg | ORAL_TABLET | Freq: Every day | ORAL | 3 refills | Status: AC

## 2017-10-31 NOTE — Progress Notes (Signed)
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  He has an abdominal aortic aneurysm.   Subjective:   PATIENT ID: Brian Clark GENDER: male DOB: 03/22/1945, MRN: [id number]   HPI  Chief Complaint  Patient presents with  . Follow-up    non-productive cough, increased SOB, anxiety    Brian Clark says that he has been sick quite frequently since the last visit.  Specifically he has had 3 exacerbations of COPD and has been treated with prednisone and azithromycin every time.  He says that he feels significantly better when he takes these medicines and then when he stops he feels worse.  He says that he has been told in the past that he has a dog allergy and he has a dog in the home and he says there is no way they are getting rid of it.  He is continuing to use his oxygen continuously  He has an abdominal aortic aneurysm and he is asking today if he can have surgery for that.  Past Medical History:  Diagnosis Date  . Anemia   . Arthritis    "numerous joints" (06/20/2014)  . Asthma   . Chronic back pain    "w/standing; not as bad when I'm walking"  . Chronic bronchitis (HCC)    "I keep it"  . COPD (chronic obstructive pulmonary disease) (HCC)   . Cyst of right kidney   . DVT (deep venous thrombosis) (HCC) 06/19/2014   right popliteal vein Hattie Perch 06/20/2014  . Hepatitis 1962   "don't know which one"  . History of blood transfusion "I've had many"   "related to ORs"  . History of duodenal ulcer   . History of stomach ulcers   . Hypertension   . On home oxygen therapy    "3L when I sleep; 4L when I'm active" (06/20/2014)  . Positive TB test   . Pulmonary embolism (HCC)    hospitalized 06/19/2014  . Spondylosis    "C1-L5" (06/20/2014)      Review of Systems  Constitutional: Negative for chills, fever, malaise/fatigue and weight loss.  HENT: Negative for congestion, nosebleeds, sinus pain and  sore throat.   Eyes: Negative for photophobia, pain and discharge.  Respiratory: Positive for cough, sputum production, shortness of breath and wheezing. Negative for hemoptysis.   Cardiovascular: Negative for chest pain, palpitations, orthopnea and leg swelling.  Gastrointestinal: Negative for abdominal pain, constipation, diarrhea, nausea and vomiting.  Genitourinary: Negative for dysuria, frequency, hematuria and urgency.  Musculoskeletal: Negative for back pain, joint pain, myalgias and neck pain.  Skin: Negative for itching and rash.  Neurological: Negative for tingling, tremors, sensory change, speech change, focal weakness, seizures, weakness and headaches.  Psychiatric/Behavioral: Negative for memory loss, substance abuse and suicidal ideas. The patient is not nervous/anxious.       Objective:  Physical Exam   Vitals:   10/31/17 0951  BP: 118/80  Pulse: 100  SpO2: 96%  Weight: 175 lb (79.4 kg)  Height: 6' 0.05" (1.83 m)   4L [place]  Gen: chronically ill appearing HENT: OP clear, TM's clear, neck supple PULM: Poor air movement, wheezing upper lobes B, normal percussion CV: RRR, no mgr, trace edema GI: BS+, soft, nontender Derm: no cyanosis or rash Psyche: normal mood and affect   CBC    Component Value Date/Time   WBC 5.9 09/30/2017 1002   RBC 4.63 09/30/2017 1002   HGB 14.8  09/30/2017 1002   HCT 43.3 09/30/2017 1002   PLT 158.0 09/30/2017 1002   MCV 93.5 09/30/2017 1002   MCH 31.6 06/22/2014 0620   MCHC 34.1 09/30/2017 1002   RDW 13.2 09/30/2017 1002   LYMPHSABS 0.8 09/30/2017 1002   MONOABS 0.5 09/30/2017 1002   EOSABS 0.3 09/30/2017 1002   BASOSABS 0.0 09/30/2017 1002     Chest imaging: March 2019 chest x-ray report from the [place] shows severe emphysema changes. March 2018 thoracic CT scan performed at the [facility] shows improved pneumonia in the left lower lobe, likely chronic bronchitis in the right lower lobe, severe pulmonary emphysema 2016 CT chest images  reviewed showing moderate to severe emphysema'  PFT: November 2017 pulmonary function testing ratio 28%, FEV1 0.94 L 30% predicted, improvement of bronchodilator was 106 cc, 18%, total lung capacity 7.0 L 129% predicted, residual volume 213% predicted, DLCO 51% predicted 2015 FEV1 1.49 L 46% predicted  Labs:  St[facility] test: [facility] 2016 stress test was within normal limits  Micro: History of sputum culture with pseudomonas in 2016  Path:  Echo:  Heart Catheterization:  Hospitalization records reviewed where he was seen for pulmonary embolism in 06/2017.  D/C'd home on Pradaxa, did not have thrombolysis.  Records from the [place] provided, he takes albuterol as needed, Pulmicort twice a day, Singulair and omeprazole.  He noted having some trouble breathing with normal oxygen values.  The primary care notes indicate that he received the Prevnar vaccine in 2015 and Pneumovax in 2016, he uses 3 L of oxygen at rest and at sleep and 4-5 with exertion  Ambulatory oximetry assessment: March 2018 by the [facility] room air at rest 94%, room air on exertion 82% after 350 feet.  O2 saturation 89 to 90% on 4 L nasal cannula after walking 500 feet     Assessment & Plan:   No diagnosis found.  Discussion: Brian Clark is a very sick individual with severe COPD and recurrent exacerbations and elevated serum eosinophil and IgE.  He has a COPD asthma overlap syndrome.  He cannot avoid the allergens (Maple, Elm, dog) and he has been told by allergist recently that immunotherapy would be an effective.  Because of this, he needs to be started on a biologic agent to help reduce the frequency of his asthma exacerbations and improve his overall prognosis.  He needs to have an abdominal aortic aneurysm repaired.  I think the best way to do this is to take an endovascular approach because an open approach would carry a risk of significant respiratory failure.  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  However, the dose can be reduced by 50% at this point and still protect you while minimizing risk of bleeding. I do not have a  problem if the [facility] needs to change you to Eliquis.  We will see you back in 3 months or sooner if needed  30 minutes spent with patient, 40-minute visit    Current Outpatient Medications:  .  albuterol (PROVENTIL HFA;VENTOLIN HFA) 108 (90 BASE) MCG/ACT inhaler, Inhale 2 puffs into the lungs 4 (four) times daily as needed for wheezing or shortness of breath. , Disp: , Rfl:  .  albuterol (PROVENTIL) (2.5 MG/3ML) 0.083% nebulizer solution, Take 2.5 mg by nebulization every 6 (six) hours as needed for wheezing or shortness of breath., Disp: , Rfl:  .  ALPRAZolam (XANAX) 0.5 MG tablet, Take 0.5 mg by mouth daily as needed for anxiety., Disp: , Rfl:  .  arformoterol (BROVANA) 15 MCG/2ML NEBU, Take 15 mcg by nebulization 2 (two) times daily., Disp: , Rfl:  .  aspirin EC 81 MG tablet, Take 81 mg by mouth daily., Disp: , Rfl:  .  benzonatate (TESSALON) 100 MG capsule, Take 100 mg by mouth 2 (two) times daily., Disp: , Rfl:  .  budesonide (PULMICORT) 0.5 MG/2ML nebulizer solution, Take 0.5 mg by nebulization 2 (two)  times daily., Disp: , Rfl:  .  budesonide-formoterol (SYMBICORT) 80-4.5 MCG/ACT inhaler, Inhale 2 puffs into the lungs 2 (two) times daily., Disp: 1 Inhaler, Rfl: 0 .  Cholecalciferol (VITAMIN D3) 2000 UNITS TABS, Take 2,000 Units by mouth daily., Disp: , Rfl:  .  cycloSPORINE (RESTASIS) 0.05 % ophthalmic emulsion, 1 drop 2 (two) times daily., Disp: , Rfl:  .  dabigatran (PRADAXA) 150 MG CAPS capsule, Take 1 capsule (150 mg total) by mouth 2 (two) times daily., Disp: 60 capsule, Rfl: 0 .  escitalopram (LEXAPRO) 10 MG tablet, Take 10 mg by mouth daily., Disp: , Rfl:  .  finasteride (PROSCAR) 5 MG tablet, Take 5 mg by mouth at bedtime., Disp: , Rfl:  .  guaifenesin (HUMIBID E) 400 MG TABS tablet, Take 1,200 mg by mouth 2 (two) times daily., Disp: , Rfl:  .  losartan (COZAAR) 50 MG tablet, Take 50 mg by mouth daily., Disp: , Rfl:  .  Misc Natural Products (TURMERIC CURCUMIN) CAPS, Take 1 capsule by mouth 3 (three) times a week. Monday, Wednesday, Friday (750 mg each), Disp: , Rfl:  .  montelukast (SINGULAIR) 10 MG tablet, Take 10 mg by mouth daily., Disp: , Rfl:  .  Multiple Vitamin (MULTIVITAMIN WITH MINERALS) TABS tablet, Take 1 tablet by mouth daily. Senior Vitamin for Men, Disp: , Rfl:  .  Multiple Vitamins-Minerals (PRESERVISION AREDS 2 PO), Take by mouth., Disp: , Rfl:  .  nicotine polacrilex (NICORETTE) 4 MG gum, Take 4 mg by mouth every hour as needed for smoking cessation., Disp: , Rfl:  .  Omega-3 Krill Oil 500 MG CAPS, Take 500 mg by mouth 4 (four) times a week. Sunday, Tuesday, Thursday, Saturday, Disp: , Rfl:  .  omeprazole (PRILOSEC) 20 MG capsule, Take 20 mg by mouth daily before breakfast., Disp: , Rfl:  .  oxybutynin (DITROPAN-XL) 10 MG 24 hr tablet, Take 10 mg by mouth at bedtime., Disp: , Rfl:  .  sildenafil (VIAGRA) 100 MG tablet, Take 50 mg by mouth daily as needed for erectile dysfunction., Disp: , Rfl:  .  simethicone (MYLICON) 80 MG chewable tablet, Chew 80 mg by mouth every 6  (six) hours as needed for flatulence., Disp: , Rfl:  .  sodium chloride (OCEAN) 0.65 % SOLN nasal spray, Place 1 spray into both nostrils 3 (three) times daily as  needed for congestion., Disp: , Rfl:  .  tamsulosin (FLOMAX) 0.4 MG CAPS capsule, Take 0.4 mg by mouth at bedtime., Disp: , Rfl:  .  tiotropium (SPIRIVA) 18 MCG inhalation capsule, Place 18 mcg into inhaler and inhale daily., Disp: , Rfl:  .  Tiotropium Bromide Monohydrate (SPIRIVA RESPIMAT) 2.5 MCG/ACT AERS, Inhale 2 puffs into the lungs daily., Disp: , Rfl:  .  Zinc 25 MG TABS, Take 25 mg by mouth 4 (four) times a week. Sunday, Tuesday, Thursday, Saturday, Disp: , Rfl:

## 2017-10-31 NOTE — Patient Instructions (Signed)
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  However, the dose can be reduced by 50% at this point and still protect you while minimizing risk of bleeding. I do not have a problem if the [facility] needs to change you to Eliquis.  We will see you back in 3 months or sooner if needed

## 2017-11-13 ENCOUNTER — Telehealth: Payer: Self-pay | Admitting: Pulmonary Disease

## 2017-11-17 NOTE — Telephone Encounter (Signed)
Error

## 2017-11-18 LAB — RESPIRATORY CULTURE OR RESPIRATORY AND SPUTUM CULTURE
MICRO NUMBER:: 90869943
RESULT: NORMAL
SPECIMEN QUALITY:: ADEQUATE

## 2017-11-18 LAB — FUNGUS CULTURE W SMEAR
MICRO NUMBER:: 90869941
SMEAR: NONE SEEN
SPECIMEN QUALITY:: ADEQUATE

## 2017-11-18 LAB — MYCOBACTERIA,CULT W/FLUOROCHROME SMEAR
MICRO NUMBER: 90869942
SMEAR:: NONE SEEN
SPECIMEN QUALITY:: ADEQUATE

## 2017-11-18 LAB — IGE: IgE (Immunoglobulin E), Serum: 207 kU/L — ABNORMAL HIGH (ref <=114)

## 2017-11-21 DIAGNOSIS — Z9109 Other allergy status, other than to drugs and biological substances: Secondary | ICD-10-CM

## 2017-11-21 NOTE — Telephone Encounter (Signed)
Update from Dr. Kendrick FriesMcQuaid " Agree he should be on omeprazole 40mg  bid    Would prefer him to be on dupilumab, please start process     Messaged patient and ask him to confirm which [facility] he [facility] to before sending meds.

## 2017-11-21 NOTE — Telephone Encounter (Signed)
Dr. Kendrick FriesMcQuaid please see patient's below message and advise. Thank you!   ----- Message -----  From: Fernanda DrumHarry Lenhardt  Sent: 11/21/2017 11:31 AM EDT  To: Max Fickleouglas McQuaid, MD Subject: Visit Follow-Up Question  [facility]. At my last visit you suggested I take omprezol twice daily. Wonder if you submitted my need for additional pills? Also. You said you would check to see if the [facility] would approve a biologic allergy med. Please advise.

## 2017-11-24 NOTE — Telephone Encounter (Signed)
The number provided by the patient does not work without inputting the patient's full SSN Called a different number @ [phone] ext [phone], [facility] Verbal order given for the direction change on the Omeprazole > med list updated  E-mail sent to patient Will forward to Dr Army MeliaMcQaid with patient's request to forego Dupixent and be referred to an allergist  Please advise, thank you.

## 2017-11-24 NOTE — Telephone Encounter (Signed)
Per BQ: okay for omeprazole 40mg  BID but unable to send [place] [facility].  E-mail sent to patient asking for fax number.  In the meantime, will route message to [facility] to assist with the process to begin dupilumab.

## 2017-11-27 NOTE — Telephone Encounter (Signed)
From BQ: Message  OK by me to refer to allergy    Referral placed E-mail sent to patient making him aware

## 2017-12-16 ENCOUNTER — Encounter: Payer: Non-veteran care | Admitting: Vascular Surgery

## 2017-12-17 ENCOUNTER — Encounter: Payer: Self-pay | Admitting: Vascular Surgery

## 2017-12-17 ENCOUNTER — Ambulatory Visit (INDEPENDENT_AMBULATORY_CARE_PROVIDER_SITE_OTHER): Payer: No Typology Code available for payment source | Admitting: Vascular Surgery

## 2017-12-17 VITALS — BP 118/83 | HR 94 | Temp 98.8°F | Resp 18 | Ht 72.0 in | Wt 175.0 lb

## 2017-12-17 DIAGNOSIS — I714 Abdominal aortic aneurysm, without rupture, unspecified: Secondary | ICD-10-CM

## 2017-12-17 NOTE — Progress Notes (Signed)
REASON FOR CONSULT:    Abdominal aortic aneurysm.  The consult is requested by the [place].  HPI:   Brian Clark is a pleasant 72 y.o. male, who is referred for evaluation of abdominal aortic aneurysm.  He has had a known aneurysm for several years that is followed in the [place].  Apparently had a CT scan most recently which showed that the aneurysm is enlarged to 4.9 cm.  He sent for vascular consultation.  He does have some chronic low back pain which is aggravated by coughing.  He has a history of lumbar disc disease.  There is no family history of aneurysmal disease that he is aware of.  I have reviewed the records that were sent by the [place].  The patient has a history of COPD.  He has a known abdominal aortic aneurysm which had grown to 4.9 cm.  Patient has significant COPD and is on home O2.  His activity is very limited because of shortness of breath.  He is pretty much in the wheelchair at this point.  Past Medical History:  Diagnosis Date  . Anemia   . Arthritis    "numerous joints" (06/20/2014)  . Asthma   . Chronic back pain    "w/standing; not as bad when I'm walking"  . Chronic bronchitis (HCC)    "I keep it"  . COPD (chronic obstructive pulmonary disease) (HCC)   . Cyst of right kidney   . DVT (deep venous thrombosis) (HCC) 06/19/2014   right popliteal vein Hattie Perch 06/20/2014  . Hepatitis 1962   "don't know which one"  . History of blood transfusion "I've had many"   "related to ORs"  . History of duodenal ulcer   . History of stomach ulcers   . Hypertension   . On home oxygen therapy    "3L when I sleep; 4L when I'm active" (06/20/2014)  . Positive TB test   . Pulmonary embolism (HCC)    hospitalized 06/19/2014  . Spondylosis    "C1-L5" (06/20/2014)    History reviewed. No pertinent family history.  SOCIAL HISTORY: Social History   Socioeconomic History  . Marital status: Married    Spouse name: Not on file  . Number of children: Not on file  . Years of  education: Not on file  . Highest education level: Not on file  Occupational History  . Not on file  Social Needs  . Financial resource strain: Not on file  . Food insecurity:    Worry: Not on file    Inability: Not on file  . Transportation needs:    Medical: Not on file    Non-medical: Not on file  Tobacco Use  . Smoking status: Former Smoker    Packs/day: 2.00    Years: 47.00    Pack years: 94.00    Types: Cigarettes    Last attempt to quit: 08/06/1999    Years since quitting: 18.3  . Smokeless tobacco: Former Neurosurgeon    Types: Chew    Quit date: 08/06/1999  . Tobacco comment: 06/20/2014 "chew nicotine gum"  Substance and Sexual Activity  . Alcohol use: Yes    Alcohol/week: 42.0 standard drinks    Types: 42 Cans of beer per week    Comment: 06/20/2014 "4-6 beers/day; drink more when I'm watching ballgame"  . Drug use: No  . Sexual activity: Never  Lifestyle  . Physical activity:    Days per week: Not on file    Minutes per session: Not  on file  . Stress: Not on file  Relationships  . Social connections:    Talks on phone: Not on file    Gets together: Not on file    Attends religious service: Not on file    Active member of club or organization: Not on file    Attends meetings of clubs or organizations: Not on file    Relationship status: Not on file  . Intimate partner violence:    Fear of current or ex partner: Not on file    Emotionally abused: Not on file    Physically abused: Not on file    Forced sexual activity: Not on file  Other Topics Concern  . Not on file  Social History Narrative  . Not on file    Allergies  Allergen Reactions  . Ciprofloxacin Other (See Comments)    CAUSED BLOOD CLOTS.   Candyce Churn Flavor Other (See Comments)    SHOWED ON ALLERGY TESTING.   . Other Other (See Comments)    Allergic to dogs, cats, maple trees, elm trees, black mold per allergy test  . Tetanus Toxoids Swelling and Rash    Current Outpatient Medications    Medication Sig Dispense Refill  . albuterol (PROVENTIL HFA;VENTOLIN HFA) 108 (90 BASE) MCG/ACT inhaler Inhale 2 puffs into the lungs 4 (four) times daily as needed for wheezing or shortness of breath.     Marland Kitchen albuterol (PROVENTIL) (2.5 MG/3ML) 0.083% nebulizer solution Take 2.5 mg by nebulization every 6 (six) hours as needed for wheezing or shortness of breath.    . ALPRAZolam (XANAX) 0.5 MG tablet Take 0.5 mg by mouth daily as needed for anxiety.    Marland Kitchen arformoterol (BROVANA) 15 MCG/2ML NEBU Take 15 mcg by nebulization 2 (two) times daily.    Marland Kitchen aspirin EC 81 MG tablet Take 81 mg by mouth daily.    Marland Kitchen azithromycin (ZITHROMAX) 250 MG tablet Take 1 tablet (250 mg total) by mouth daily. 30 tablet 3  . benzonatate (TESSALON) 100 MG capsule Take 100 mg by mouth 2 (two) times daily.    . budesonide (PULMICORT) 0.5 MG/2ML nebulizer solution Take 0.5 mg by nebulization 2 (two) times daily.    . budesonide-formoterol (SYMBICORT) 80-4.5 MCG/ACT inhaler Inhale 2 puffs into the lungs 2 (two) times daily. 1 Inhaler 0  . Cholecalciferol (VITAMIN D3) 2000 UNITS TABS Take 2,000 Units by mouth daily.    . cycloSPORINE (RESTASIS) 0.05 % ophthalmic emulsion 1 drop 2 (two) times daily.    . dabigatran (PRADAXA) 150 MG CAPS capsule Take 1 capsule (150 mg total) by mouth 2 (two) times daily. 60 capsule 0  . escitalopram (LEXAPRO) 10 MG tablet Take 10 mg by mouth daily.    Marland Kitchen guaifenesin (HUMIBID E) 400 MG TABS tablet Take 1,200 mg by mouth 2 (two) times daily.    Marland Kitchen losartan (COZAAR) 50 MG tablet Take 50 mg by mouth daily.    . Misc Natural Products (TURMERIC CURCUMIN) CAPS Take 1 capsule by mouth 3 (three) times a week. Monday, Wednesday, Friday (750 mg each)    . montelukast (SINGULAIR) 10 MG tablet Take 10 mg by mouth daily.    . Multiple Vitamin (MULTIVITAMIN WITH MINERALS) TABS tablet Take 1 tablet by mouth daily. Senior Vitamin for Men    . Multiple Vitamins-Minerals (PRESERVISION AREDS 2 PO) Take by mouth.    .  nicotine polacrilex (NICORETTE) 4 MG gum Take 4 mg by mouth every hour as needed for smoking cessation.    Marland Kitchen  Omega-3 Krill Oil 500 MG CAPS Take 500 mg by mouth 4 (four) times a week. Sunday, Tuesday, Thursday, Saturday    . omeprazole (PRILOSEC) 20 MG capsule Take 20 mg by mouth daily before breakfast.    . oxybutynin (DITROPAN-XL) 10 MG 24 hr tablet Take 10 mg by mouth at bedtime.    . predniSONE (DELTASONE) 5 MG tablet Take 5 mg by mouth daily with breakfast.    . simethicone (MYLICON) 80 MG chewable tablet Chew 80 mg by mouth every 6 (six) hours as needed for flatulence.    . tamsulosin (FLOMAX) 0.4 MG CAPS capsule Take 0.4 mg by mouth at bedtime.    Marland Kitchen tiotropium (SPIRIVA) 18 MCG inhalation capsule Place 2.5 mcg into inhaler and inhale daily.     . Tiotropium Bromide Monohydrate (SPIRIVA RESPIMAT) 2.5 MCG/ACT AERS Inhale 2 puffs into the lungs daily.    . Zinc 25 MG TABS Take 25 mg by mouth 4 (four) times a week. Sunday, Tuesday, Thursday, Saturday    . finasteride (PROSCAR) 5 MG tablet Take 5 mg by mouth at bedtime.    . sildenafil (VIAGRA) 100 MG tablet Take 50 mg by mouth daily as needed for erectile dysfunction.    . sodium chloride (OCEAN) 0.65 % SOLN nasal spray Place 1 spray into both nostrils 3 (three) times daily as needed for congestion.     No current facility-administered medications for this visit.     REVIEW OF SYSTEMS:  [X]  denotes positive finding, [ ]  denotes negative finding Cardiac  Comments:  Chest pain or chest pressure:    Shortness of breath upon exertion: x   Short of breath when lying flat: x   Irregular heart rhythm: x       Vascular    Pain in calf, thigh, or hip brought on by ambulation: x   Pain in feet at night that wakes you up from your sleep:     Blood clot in your veins: x   Leg swelling:         Pulmonary    Oxygen at home: x   Productive cough:     Wheezing:  x       Neurologic    Sudden weakness in arms or legs:     Sudden numbness in arms  or legs:     Sudden onset of difficulty speaking or slurred speech:    Temporary loss of vision in one eye:     Problems with dizziness:  x       Gastrointestinal    Blood in stool:     Vomited blood:         Genitourinary    Burning when urinating:     Blood in urine:        Psychiatric    Major depression:         Hematologic    Bleeding problems:    Problems with blood clotting too easily:        Skin    Rashes or ulcers:        Constitutional    Fever or chills:     PHYSICAL EXAM:   Vitals:   12/17/17 1214  BP: 118/83  Pulse: 94  Resp: 18  Temp: 98.8 F (37.1 C)  SpO2: 93%  Weight: 175 lb (79.4 kg)  Height: 6' (1.829 m)    GENERAL: The patient is a well-nourished male, in no acute distress. The vital signs are documented above. CARDIAC: There is a  regular rate and rhythm.  VASCULAR: I do not detect carotid bruits. The patient has palpable femoral pulses.  I cannot palpate pedal pulses.  Both feet are warm and well-perfused.  He has no significant lower extremity swelling. PULMONARY: There is good air exchange bilaterally without wheezing or rales. ABDOMEN: Soft and non-tender with normal pitched bowel sounds.  MUSCULOSKELETAL: There are no major deformities or cyanosis. NEUROLOGIC: No focal weakness or paresthesias are detected. SKIN: There are no ulcers or rashes noted. PSYCHIATRIC: The patient has a normal affect.  DATA:    I reviewed approximately 30 pages of records from the [place].  The largest size that I can see recorded for the aneurysm is 4.9 cm on his most recent CT scan.  I do not have the images to review.   ASSESSMENT & PLAN:   4.9 CM INFRARENAL ABDOMINAL AORTIC ANEURYSM: I explained that in a normal risk patient we would consider elective repair of an aneurysm at 5.5 cm.  I think he is at increased risk given his severe COPD.  He would be at high risk for open repair given his pulmonary issues.  I recommended a follow-up duplex scan in 6 months  and I will see him back at that time.  If the aneurysm enlarges at that point then I think his next follow-up study should be a CT scan with 1 mm cuts in order to determine if he might be a candidate for an endovascular repair if the aneurysm continues to enlarge.  Fortunately he is not a smoker and his blood pressure is under good control.  I will see him back in 6 months.  He knows to call sooner if he has problems.  A total of 45 minutes was spent on this visit. 25 minutes was face to face time. More than 50% of the time was spent on counseling and coordinating with the patient.    Waverly Ferrari [facility] [phone]

## 2018-01-12 ENCOUNTER — Other Ambulatory Visit (INDEPENDENT_AMBULATORY_CARE_PROVIDER_SITE_OTHER): Payer: No Typology Code available for payment source

## 2018-01-12 ENCOUNTER — Ambulatory Visit (INDEPENDENT_AMBULATORY_CARE_PROVIDER_SITE_OTHER): Payer: No Typology Code available for payment source | Admitting: Pulmonary Disease

## 2018-01-12 ENCOUNTER — Encounter: Payer: Self-pay | Admitting: Pulmonary Disease

## 2018-01-12 VITALS — BP 118/80 | HR 70 | Wt 184.0 lb

## 2018-01-12 DIAGNOSIS — Z23 Encounter for immunization: Secondary | ICD-10-CM | POA: Diagnosis not present

## 2018-01-12 DIAGNOSIS — I2609 Other pulmonary embolism with acute cor pulmonale: Secondary | ICD-10-CM

## 2018-01-12 DIAGNOSIS — J42 Unspecified chronic bronchitis: Secondary | ICD-10-CM | POA: Diagnosis not present

## 2018-01-12 DIAGNOSIS — R5383 Other fatigue: Secondary | ICD-10-CM

## 2018-01-12 DIAGNOSIS — I2782 Chronic pulmonary embolism: Secondary | ICD-10-CM

## 2018-01-12 DIAGNOSIS — M7989 Other specified soft tissue disorders: Secondary | ICD-10-CM | POA: Diagnosis not present

## 2018-01-12 DIAGNOSIS — J9611 Chronic respiratory failure with hypoxia: Secondary | ICD-10-CM | POA: Diagnosis not present

## 2018-01-12 DIAGNOSIS — F339 Major depressive disorder, recurrent, unspecified: Secondary | ICD-10-CM

## 2018-01-12 LAB — BRAIN NATRIURETIC PEPTIDE: PRO B NATRI PEPTIDE: 235 pg/mL — AB (ref 0.0–100.0)

## 2018-01-12 LAB — TSH: TSH: 1.25 u[IU]/mL (ref 0.35–4.50)

## 2018-01-12 MED ORDER — OMEPRAZOLE 20 MG PO CPDR: 20.0000 mg | DELAYED_RELEASE_CAPSULE | Freq: Two times a day (BID) | ORAL | 11 refills | Status: DC

## 2018-01-12 MED ORDER — ESCITALOPRAM OXALATE 20 MG PO TABS: 20.0000 mg | ORAL_TABLET | Freq: Every day | ORAL | 11 refills | Status: DC

## 2018-01-12 MED ORDER — ESCITALOPRAM OXALATE 20 MG PO TABS: 20.0000 mg | ORAL_TABLET | Freq: Every day | ORAL | 0 refills | Status: DC

## 2018-01-12 MED ORDER — FUROSEMIDE 40 MG PO TABS: 40.0000 mg | ORAL_TABLET | Freq: Every day | ORAL | 1 refills | Status: DC | PRN

## 2018-01-12 MED ORDER — OMEPRAZOLE 20 MG PO CPDR: 20.0000 mg | DELAYED_RELEASE_CAPSULE | Freq: Every day | ORAL | 0 refills | Status: DC

## 2018-01-12 NOTE — Patient Instructions (Signed)
Major depression: Talk to a counselor Increase Lexapro to 20 mg at night  Severe COPD with recurrent exacerbations: Continue Brovana Continue Pulmicort Continue Spiriva Continue prednisone 5 mg daily Continue azithromycin  Gastroesophageal reflux disease: Increase omeprazole to twice a day  COPD-asthma overlap syndrome: I will reach out to Dr. Lucie Leather to see if he can see you again, though I suspect you will need another referral from the [place] I think it is worthwhile for you to be considered for immunotherapy or biologic therapy  Ankle swelling: This is due to pulmonary hypertension Take furosemide 40 mg daily as needed for the ankle swelling  I need you to give me the contact information for your primary care doctor at the [place] so that we can communicate medication changes  Follow-up in 2 to 3 months or sooner if needed

## 2018-01-12 NOTE — Progress Notes (Signed)
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  He has an abdominal aortic aneurysm.   Subjective:   PATIENT ID: Brian Clark GENDER: male DOB: [birth year], MRN: [id number]   HPI  Chief Complaint  Patient presents with  . Follow-up    back injury, increased fatigue    Brian Clark comes to clinic today with yet another litany of complaints.  Specifically he says that he remains short of breath but he has not had an exacerbation of his COPD since the last visit.  He has been compliant with the prednisone, azithromycin, oxygen, and inhaled medicines since the last visit.  He has noticed an increase in leg swelling and has gained 10 pounds since the last visit.  He has not seen an allergist since the last visit.  He has not had a change in his omeprazole since the last visit.  He says that he is depressed and just stares at the ceiling.  He has "no energy".  He sleeps fine but in fact is sleeping too much during the daytime and that he is taking naps in the morning and in the evening.  His wife says that she is concerned because he will not talk to her and she wants to know if she should call the ambulance over this.  Past Medical History:  Diagnosis Date  . Anemia   . Arthritis    "numerous joints" (06/20/2014)  . Asthma   . Chronic back pain    "w/standing; not as bad when I'm walking"  . Chronic bronchitis (HCC)    "I keep it"  . COPD (chronic obstructive pulmonary disease) (HCC)   . Cyst of right kidney   . DVT (deep venous thrombosis) (HCC) 06/19/2014   right popliteal vein Brian Clark 06/20/2014  . Hepatitis 1962   "don't know which one"  . History of blood transfusion "I've had many"   "related to ORs"  . History of duodenal ulcer   . History of stomach ulcers   . Hypertension   . On home oxygen therapy    "3L when I sleep; 4L when I'm active" (06/20/2014)  . Positive TB test   . Pulmonary  embolism (HCC)    hospitalized 06/19/2014  . Spondylosis    "C1-L5" (06/20/2014)      Review of Systems  Constitutional: Negative for chills, fever, malaise/fatigue and weight loss.  HENT: Negative for congestion, nosebleeds, sinus pain and sore throat.   Eyes: Negative for photophobia, pain and discharge.  Respiratory: Negative for cough, hemoptysis, sputum production, shortness of breath and wheezing.   Cardiovascular: Negative for chest pain, palpitations, orthopnea and leg swelling.  Gastrointestinal: Negative for abdominal pain, constipation, diarrhea, nausea and vomiting.  Genitourinary: Negative for dysuria, frequency, hematuria and urgency.  Musculoskeletal: Negative for back pain, joint pain, myalgias and neck pain.  Skin: Negative for itching and rash.  Neurological: Negative for tingling, tremors, sensory change, speech change, focal weakness, seizures, weakness and headaches.  Psychiatric/Behavioral: Negative for memory loss, substance abuse and suicidal ideas. The patient is not nervous/anxious.       Objective:  Physical Exam   Vitals:   01/12/18 1058  BP: 118/80  Pulse: 70  SpO2: 99%  Weight: 184 lb (83.5 kg)   4L [name]  Gen: chronically ill appearing HENT: OP clear, TM's clear, neck supple PULM: CTA B, normal percussion CV: RRR, no mgr, trace edema GI: BS+, soft, nontender  Derm: ankle swelling noted, no cyanosis or rash Psyche: normal mood and affect    CBC    Component Value Date/Time   WBC 5.9 09/30/2017 1002   RBC 4.63 09/30/2017 1002   HGB 14.8 09/30/2017 1002   HCT 43.3 09/30/2017 1002   PLT 158.0 09/30/2017 1002   MCV 93.5 09/30/2017 1002   MCH 31.6 06/22/2014 0620   MCHC 34.1 09/30/2017 1002   RDW 13.2 09/30/2017 1002   LYMPHSABS 0.8 09/30/2017 1002   MONOABS 0.5 09/30/2017 1002   EOSABS 0.3 09/30/2017 1002   BASOSABS 0.0 09/30/2017 1002     Chest imaging: March 2019 chest x-ray report from the [place] shows severe emphysema  changes. March 2018 thoracic CT scan performed at the [facility] shows improved pneumonia in the left lower lobe, likely chronic bronchitis in the right lower lobe, severe pulmonary emphysema 2016 CT chest images reviewed showing moderate to severe emphysema'  PFT: November 2017 pulmonary function testing ratio 28%, FEV1 0.94 L 30% predicted, improvement of bronchodilator was 106 cc, 18%, total lung capacity 7.0 L 129% predicted, residual volume 213% predicted, DLCO 51% predicted 2015 FEV1 1.49 L 46% predicted  Labs:  St[facility] test: [facility] stress test was within normal limits  Micro: History of sputum culture with pseudomonas in 2016  Path:  Echo:  Heart Catheterization:  Hospitalization records reviewed where he was seen for pulmonary embolism in 06/2017.  D/C'd home on Pradaxa, did not have thrombolysis.  Records from the [place] provided, he takes albuterol as needed, Pulmicort twice a day, Singulair and omeprazole.  He noted having some trouble breathing with normal oxygen values.  The primary care notes indicate that he received the Prevnar vaccine in 2015 and Pneumovax in 2016, he uses 3 L of oxygen at rest and at sleep and 4-5 with exertion  Ambulatory oximetry assessment: March 2018 by the [facility] room air at rest 94%, room air on exertion 82% after 350 feet.  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  He has a COPD asthma overlap syndrome with an elevated serum IgE and eosinophil count which is now effectively suppressed by daily  prednisone.  We have also added daily azithromycin to reduce the frequency of exacerbations.  While I am glad that his exacerbations have improved I believe that he needs to be considered for treatment for immunotherapy or biologic therapy.  On the last visit he refused immunotherapy.  He is profoundly depressed.  I think that this is unlikely to have an underlying medical cause but there is value and as checking a thyroid level.  Plan: Major depression: Talk to a counselor Increase Lexapro to 20 mg at night  Severe COPD with recurrent exacerbations: Continue Brovana Continue Pulmicort Continue Spiriva Continue prednisone 5 mg daily Continue azithromycin  Gastroesophageal reflux disease: Increase omeprazole to twice a day  COPD-asthma overlap syndrome: I will reach out to Dr. Lucie Leather to see if he can see you again, though I suspect you will need another referral from the [place] I think it is worthwhile for you to be considered for  immunotherapy or biologic therapy  Ankle swelling: This is due to pulmonary hypertension Take furosemide 40 mg daily as needed for the ankle swelling  I need you to give me the contact information for your primary care doctor at the [place] so that we can communicate medication changes  Follow-up in 2 to 3 months or sooner if needed  Greater than 50% of this 40-minute visit spent face-to-face    Current Outpatient Medications:  .  albuterol (PROVENTIL HFA;VENTOLIN HFA) 108 (90 BASE) MCG/ACT inhaler, Inhale 2 puffs into the lungs 4 (four) times daily as needed for wheezing or shortness of breath. , Disp: , Rfl:  .  albuterol (PROVENTIL) (2.5 MG/3ML) 0.083% nebulizer solution, Take 2.5 mg by nebulization every 6 (six) hours as needed for wheezing or shortness of breath., Disp: , Rfl:  .  ALPRAZolam (XANAX) 0.5 MG tablet, Take 0.5 mg by mouth daily as needed for anxiety., Disp: , Rfl:  .  arformoterol (BROVANA) 15 MCG/2ML NEBU, Take 15 mcg by nebulization 2 (two)  times daily., Disp: , Rfl:  .  aspirin EC 81 MG tablet, Take 81 mg by mouth daily., Disp: , Rfl:  .  azithromycin (ZITHROMAX) 250 MG tablet, Take 1 tablet (250 mg total) by mouth daily., Disp: 30 tablet, Rfl: 3 .  benzonatate (TESSALON) 100 MG capsule, Take 100 mg by mouth 2 (two) times daily., Disp: , Rfl:  .  budesonide (PULMICORT) 0.5 MG/2ML nebulizer solution, Take 0.5 mg by nebulization 2 (two) times daily., Disp: , Rfl:  .  budesonide-formoterol (SYMBICORT) 80-4.5 MCG/ACT inhaler, Inhale 2 puffs into the lungs 2 (two) times daily., Disp: 1 Inhaler, Rfl: 0 .  Cholecalciferol (VITAMIN D3) 2000 UNITS TABS, Take 2,000 Units by mouth daily., Disp: , Rfl:  .  cycloSPORINE (RESTASIS) 0.05 % ophthalmic emulsion, 1 drop 2 (two) times daily., Disp: , Rfl:  .  dabigatran (PRADAXA) 150 MG CAPS capsule, Take 1 capsule (150 mg total) by mouth 2 (two) times daily., Disp: 60 capsule, Rfl: 0 .  escitalopram (LEXAPRO) 10 MG tablet, Take 10 mg by mouth daily., Disp: , Rfl:  .  finasteride (PROSCAR) 5 MG tablet, Take 5 mg by mouth at bedtime., Disp: , Rfl:  .  guaifenesin (HUMIBID E) 400 MG TABS tablet, Take 1,200 mg by mouth 2 (two) times daily., Disp: , Rfl:  .  losartan (COZAAR) 50 MG tablet, Take 50 mg by mouth daily., Disp: , Rfl:  .  Misc Natural Products (TURMERIC CURCUMIN) CAPS, Take 1 capsule by mouth 3 (three) times a week. Monday, Wednesday, Friday (750 mg each), Disp: , Rfl:  .  montelukast (SINGULAIR) 10 MG tablet, Take 10 mg by mouth daily., Disp: , Rfl:  .  Multiple Vitamin (MULTIVITAMIN WITH MINERALS) TABS tablet, Take 1 tablet by mouth daily. Senior Vitamin for Men, Disp: , Rfl:  .  Multiple Vitamins-Minerals (PRESERVISION AREDS 2 PO), Take by mouth., Disp: , Rfl:  .  nicotine polacrilex (NICORETTE) 4 MG gum, Take 4 mg by mouth every hour as needed for smoking cessation., Disp: , Rfl:  .  Omega-3 Krill Oil 500 MG CAPS, Take 500 mg by mouth 4 (four) times a week. Sunday, Tuesday, Thursday,  Saturday, Disp: , Rfl:  .  omeprazole (PRILOSEC) 20 MG capsule, Take 20 mg by mouth daily before breakfast., Disp: , Rfl:  .  oxybutynin (DITROPAN-XL) 10 MG 24 hr tablet, Take 10 mg by mouth at bedtime., Disp: , Rfl:  .  predniSONE (DELTASONE) 5 MG tablet, Take  5 mg by mouth daily with breakfast., Disp: , Rfl:  .  sildenafil (VIAGRA) 100 MG tablet, Take 50 mg by mouth daily as needed for erectile dysfunction., Disp: , Rfl:  .  simethicone (MYLICON) 80 MG chewable tablet, Chew 80 mg by mouth every 6 (six) hours as needed for flatulence., Disp: , Rfl:  .  sodium chloride (OCEAN) 0.65 % SOLN nasal spray, Place 1 spray into both nostrils 3 (three) times daily as needed for congestion., Disp: , Rfl:  .  tamsulosin (FLOMAX) 0.4 MG CAPS capsule, Take 0.4 mg by mouth at bedtime., Disp: , Rfl:  .  tiotropium (SPIRIVA) 18 MCG inhalation capsule, Place 2.5 mcg into inhaler and inhale daily. , Disp: , Rfl:  .  Tiotropium Bromide Monohydrate (SPIRIVA RESPIMAT) 2.5 MCG/ACT AERS, Inhale 2 puffs into the lungs daily., Disp: , Rfl:  .  Zinc 25 MG TABS, Take 25 mg by mouth 4 (four) times a week. Sunday, Tuesday, Thursday, Saturday, Disp: , Rfl:

## 2018-01-12 NOTE — Addendum Note (Signed)
Addended by: Waldemar Dickens B on: 01/12/2018 11:59 AM   Modules accepted: Orders

## 2018-01-13 ENCOUNTER — Other Ambulatory Visit: Payer: Self-pay | Admitting: Pulmonary Disease

## 2018-01-13 NOTE — Telephone Encounter (Signed)
Dr. Kendrick Fries please see message sent.Marland Kitchen  "Not a question but Dr Kendrick Fries wanted information about Mach[facility]'s primary [facility] physician.  Dr. Adan Sis [facility] [place] [phone] FaX [phone] The general # for the hospital is [phone]"

## 2018-01-13 NOTE — Telephone Encounter (Signed)
Patient wife is calling to speak to the nurse about lab results she saw on the portable. CB is [phone]

## 2018-01-13 NOTE — Progress Notes (Signed)
His wife shared his PCP's name and contact information with [place]:        Dr. Adan Sis  [facility]  [place]  [phone]  FaX [phone]  The general # for the hospital is  [phone]

## 2018-01-26 NOTE — Telephone Encounter (Signed)
Called and spoke with patient. He stated that he is NOT having chest pain or SOB. He is simply having issues with not sleeping and not having energy. He noticed this after his dosage of Lexapro was increased. He wanted to make you aware to see if he needs to decrease his amount or switch to another medication.   BQ, please advise. Thanks!

## 2018-02-16 ENCOUNTER — Other Ambulatory Visit: Payer: Self-pay | Admitting: Pulmonary Disease

## 2018-02-16 ENCOUNTER — Telehealth: Payer: Self-pay | Admitting: Pulmonary Disease

## 2018-02-16 NOTE — Telephone Encounter (Signed)
Call made to [facility], verbal order given over phone. Nothing further is needed at this time.

## 2018-03-07 ENCOUNTER — Other Ambulatory Visit: Payer: Self-pay | Admitting: Pulmonary Disease

## 2018-03-16 NOTE — Telephone Encounter (Signed)
Patient sent a message stating that he was a [place] patient, and he wanted you to have this contact info in case you should ever need it.  Primary Care Physician  Dr Raynelle Fanning [place]  [phone] Ext [phone]  FaX [phone]  [address] [place] [address]    Will route message to Dr. Kendrick Fries and Morrie Sheldon as Lorain Childes

## 2018-03-17 ENCOUNTER — Encounter: Payer: Self-pay | Admitting: Vascular Surgery

## 2018-03-19 NOTE — Telephone Encounter (Signed)
Patient [facility] info placed in shared specialty comment (blue sticky note tab to the left), for future references.    Will route to Dr. Kendrick Fries as Lorain Childes

## 2018-03-19 NOTE — Telephone Encounter (Signed)
[facility] information placed in shared specialty comment tab in epic.

## 2018-03-25 ENCOUNTER — Encounter: Payer: Self-pay | Admitting: Pulmonary Disease

## 2018-03-25 ENCOUNTER — Ambulatory Visit (INDEPENDENT_AMBULATORY_CARE_PROVIDER_SITE_OTHER): Payer: No Typology Code available for payment source | Admitting: Pulmonary Disease

## 2018-03-25 VITALS — BP 122/74 | HR 92 | Ht 72.0 in | Wt 175.0 lb

## 2018-03-25 DIAGNOSIS — I2782 Chronic pulmonary embolism: Secondary | ICD-10-CM

## 2018-03-25 DIAGNOSIS — J42 Unspecified chronic bronchitis: Secondary | ICD-10-CM | POA: Diagnosis not present

## 2018-03-25 DIAGNOSIS — Z5181 Encounter for therapeutic drug level monitoring: Secondary | ICD-10-CM

## 2018-03-25 DIAGNOSIS — F339 Major depressive disorder, recurrent, unspecified: Secondary | ICD-10-CM

## 2018-03-25 DIAGNOSIS — J9611 Chronic respiratory failure with hypoxia: Secondary | ICD-10-CM | POA: Diagnosis not present

## 2018-03-25 DIAGNOSIS — I2609 Other pulmonary embolism with acute cor pulmonale: Secondary | ICD-10-CM

## 2018-03-25 LAB — COMPREHENSIVE METABOLIC PANEL
ALT: 11 U/L (ref 0–53)
AST: 16 U/L (ref 0–37)
Albumin: 4 g/dL (ref 3.5–5.2)
Alkaline Phosphatase: 72 U/L (ref 39–117)
BUN: 10 mg/dL (ref 6–23)
CALCIUM: 9.2 mg/dL (ref 8.4–10.5)
CO2: 35 mEq/L — ABNORMAL HIGH (ref 19–32)
CREATININE: 0.73 mg/dL (ref 0.40–1.50)
Chloride: 95 mEq/L — ABNORMAL LOW (ref 96–112)
GFR: 112.35 mL/min (ref >60.00)
Glucose, Bld: 94 mg/dL (ref 70–99)
Potassium: 3.4 mEq/L — ABNORMAL LOW (ref 3.5–5.1)
Sodium: 137 mEq/L (ref 135–145)
Total Bilirubin: 0.4 mg/dL (ref 0.2–1.2)
Total Protein: 7.1 g/dL (ref 6.0–8.3)

## 2018-03-25 NOTE — Progress Notes (Signed)
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  He has an abdominal aortic aneurysm.  Has severe depressive disorder.  Subjective:   PATIENT ID: Brian Clark GENDER: male DOB: 04-Jul-1945, MRN: [id number]   HPI  Chief Complaint  Patient presents with  . Follow-up    Sob doing better since on abx and Prednisone,no cough,wheezing,cp with anxiety   Brian Clark reports that he has been doing better since the last visit.  He says ever since starting the low-dose prednisone and azithromycin he has not had an exacerbation and his breathing has improved.  It seems that his depression has also improved better recently as well and he is now taking trazodone at night.  His primary care doctor changed his medicines around.  He continues to use and benefit from his oxygen routinely.  Past Medical History:  Diagnosis Date  . Anemia   . Arthritis    "numerous joints" (06/20/2014)  . Asthma   . Chronic back pain    "w/standing; not as bad when I'm walking"  . Chronic bronchitis (HCC)    "I keep it"  . COPD (chronic obstructive pulmonary disease) (HCC)   . Cyst of right kidney   . DVT (deep venous thrombosis) (HCC) 06/19/2014   right popliteal vein Brian Clark 06/20/2014  . Hepatitis 1962   "don't know which one"  . History of blood transfusion "I've had many"   "related to ORs"  . History of duodenal ulcer   . History of stomach ulcers   . Hypertension   . On home oxygen therapy    "3L when I sleep; 4L when I'm active" (06/20/2014)  . Positive TB test   . Pulmonary embolism (HCC)    hospitalized 06/19/2014  . Spondylosis    "C1-L5" (06/20/2014)      Review of Systems  Constitutional: Negative for chills, fever, malaise/fatigue and weight loss.  HENT: Negative for congestion, nosebleeds, sinus pain and sore throat.   Eyes: Negative for photophobia, pain and discharge.  Respiratory: Negative for cough,  hemoptysis, sputum production, shortness of breath and wheezing.   Cardiovascular: Negative for chest pain, palpitations, orthopnea and leg swelling.  Gastrointestinal: Negative for abdominal pain, constipation, diarrhea, nausea and vomiting.  Genitourinary: Negative for dysuria, frequency, hematuria and urgency.  Musculoskeletal: Negative for back pain, joint pain, myalgias and neck pain.  Skin: Negative for itching and rash.  Neurological: Negative for tingling, tremors, sensory change, speech change, focal weakness, seizures, weakness and headaches.  Psychiatric/Behavioral: Negative for memory loss, substance abuse and suicidal ideas. The patient is not nervous/anxious.       Objective:  Physical Exam   Vitals:   03/25/18 1117  BP: 122/74  Pulse: 92  SpO2: 95%  Weight: 175 lb (79.4 kg)  Height: 6' (1.829 m)   4L [place]  Gen: chronically ill appearing in wheelchair HENT: OP clear, TM's clear, neck supple PULM: Poor air movement B, normal percussion CV: RRR, no mgr, trace edema GI: BS+, soft, nontender Derm: no cyanosis or rash Psyche: normal mood and affect     CBC    Component Value Date/Time   WBC 5.9 09/30/2017 1002   RBC 4.63 09/30/2017 1002   HGB 14.8 09/30/2017 1002   HCT 43.3 09/30/2017 1002   PLT 158.0 09/30/2017 1002   MCV 93.5 09/30/2017 1002   MCH 31.6 06/22/2014 0620   MCHC 34.1 09/30/2017 1002   RDW 13.2  09/30/2017 1002   LYMPHSABS 0.8 09/30/2017 1002   MONOABS 0.5 09/30/2017 1002   EOSABS 0.3 09/30/2017 1002   BASOSABS 0.0 09/30/2017 1002     Chest imaging: March 2019 chest x-ray report from the [place] shows severe emphysema changes. March 2018 thoracic CT scan performed at the [facility] shows improved pneumonia in the left lower lobe, likely chronic bronchitis in the right lower lobe, severe pulmonary emphysema 2016 CT chest images reviewed showing moderate to severe emphysema'  PFT: November 2017 pulmonary function testing ratio 28%, FEV1 0.94 L 30%  predicted, improvement of bronchodilator was 106 cc, 18%, total lung capacity 7.0 L 129% predicted, residual volume 213% predicted, DLCO 51% predicted 2015 FEV1 1.49 L 46% predicted  Labs:  Stress test: [facility] stress test was within normal limits  Micro: History of sputum culture with pseudomonas in 2016  Path:  Echo:  Heart Catheterization:  Hospitalization records reviewed where he was seen for pulmonary embolism in 06/2017.  D/C'd home on Pradaxa, did not have thrombolysis.  Records from the [place] provided, he takes albuterol as needed, Pulmicort twice a day, Singulair and omeprazole.  He noted having some trouble breathing with normal oxygen values.  The primary care notes indicate that he received the Prevnar vaccine in 2015 and Pneumovax in 2016, he uses 3 L of oxygen at rest and at sleep and 4-5 with exertion  Ambulatory oximetry assessment: March 2018 by the [facility] room air at rest 94%, room air on exertion 82% after 350 feet.  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 returns to clinic today and at least for the first time since I have seen him he is not complaining about his COPD.  He seems to be compliant with his current regimen he has not had an exacerbation.  He remains profoundly ill and exceedingly complex with multiple medical problems but fortunately nothing seems to be falling apart today.  Plan: Severe COPD with recurrent exacerbations: Continue Brovana Continue Pulmicort Continue Spiriva Continue prednisone 5 mg daily Continue azithromycin as you are doing We will change the prescriptions for  prednisone and azithromycin to 90-day prescriptions Stay active Practice good hand hygiene We will check a 12 lead EKG and liver function test today to make sure there is no toxicity from these medicines  Chronic respiratory failure with hypoxemia Continue using continuous O2 as you are doing  Depression: Keep follow-up with your primary care doctor regarding this  Follow-up 54-months    Current Outpatient Medications:  .  albuterol (PROVENTIL HFA;VENTOLIN HFA) 108 (90 BASE) MCG/ACT inhaler, Inhale 2 puffs into the lungs 4 (four) times daily as needed for wheezing or shortness of breath. , Disp: , Rfl:  .  albuterol (PROVENTIL) (2.5 MG/3ML) 0.083% nebulizer solution, Take 2.5 mg by nebulization every 6 (six) hours as needed for wheezing or shortness of breath., Disp: , Rfl:  .  ALPRAZolam (XANAX) 0.5 MG tablet, Take 0.5 mg by mouth daily as needed for anxiety., Disp: , Rfl:  .  arformoterol (BROVANA) 15 MCG/2ML NEBU, Take 15 mcg by nebulization 2 (two) times daily., Disp: , Rfl:  .  aspirin EC 81 MG tablet,  Take 81 mg by mouth daily., Disp: , Rfl:  .  azithromycin (ZITHROMAX) 250 MG tablet, Take 250 mg by mouth daily., Disp: , Rfl:  .  benzonatate (TESSALON) 100 MG capsule, Take 100 mg by mouth 3 (three) times daily. , Disp: , Rfl:  .  budesonide (PULMICORT) 0.5 MG/2ML nebulizer solution, Take 0.5 mg by nebulization 2 (two) times daily., Disp: , Rfl:  .  budesonide-formoterol (SYMBICORT) 80-4.5 MCG/ACT inhaler, Inhale 2 puffs into the lungs 2 (two) times daily., Disp: 1 Inhaler, Rfl: 0 .  Cholecalciferol (VITAMIN D3) 2000 UNITS TABS, Take 2,000 Units by mouth daily., Disp: , Rfl:  .  cycloSPORINE (RESTASIS) 0.05 % ophthalmic emulsion, 1 drop 2 (two) times daily., Disp: , Rfl:  .  dabigatran (PRADAXA) 150 MG CAPS capsule, Take 1 capsule (150 mg total) by mouth 2 (two) times daily., Disp: 60 capsule, Rfl: 0 .  finasteride (PROSCAR) 5 MG tablet, Take 5 mg by mouth at bedtime., Disp: , Rfl:    .  furosemide (LASIX) 40 MG tablet, Take 1 tablet (40 mg total) by mouth daily as needed for up to 15 doses (ankle swelling). (Patient taking differently: Take 40 mg by mouth daily. ), Disp: 15 tablet, Rfl: 1 .  guaifenesin (HUMIBID E) 400 MG TABS tablet, Take 1,200 mg by mouth 2 (two) times daily., Disp: , Rfl:  .  ketotifen (ZADITOR) 0.025 % ophthalmic solution, Place 2 drops into both eyes 2 (two) times daily., Disp: , Rfl:  .  losartan (COZAAR) 50 MG tablet, Take 50 mg by mouth daily., Disp: , Rfl:  .  Misc Natural Products (TURMERIC CURCUMIN) CAPS, Take 1 capsule by mouth 3 (three) times a week. Monday, Wednesday, Friday (750 mg each), Disp: , Rfl:  .  montelukast (SINGULAIR) 10 MG tablet, Take 10 mg by mouth daily., Disp: , Rfl:  .  Multiple Vitamin (MULTIVITAMIN WITH MINERALS) TABS tablet, Take 1 tablet by mouth daily. Senior Vitamin for Men, Disp: , Rfl:  .  Multiple Vitamins-Minerals (PRESERVISION AREDS 2 PO), Take by mouth., Disp: , Rfl:  .  nicotine polacrilex (NICORETTE) 4 MG gum, Take 4 mg by mouth every hour as needed for smoking cessation., Disp: , Rfl:  .  Omega-3 Krill Oil 500 MG CAPS, Take 500 mg by mouth 4 (four) times a week. Sunday, Tuesday, Thursday, Saturday, Disp: , Rfl:  .  omeprazole (PRILOSEC) 20 MG capsule, Take 20 mg by mouth 2 (two) times daily before a meal. , Disp: , Rfl:  .  oxybutynin (DITROPAN-XL) 10 MG 24 hr tablet, Take 10 mg by mouth at bedtime., Disp: , Rfl:  .  predniSONE (DELTASONE) 5 MG tablet, Take 5 mg by mouth daily with breakfast., Disp: , Rfl:  .  sildenafil (VIAGRA) 100 MG tablet, Take 50 mg by mouth daily as needed for erectile dysfunction., Disp: , Rfl:  .  simethicone (MYLICON) 80 MG chewable tablet, Chew 80 mg by mouth every 6 (six) hours as needed for flatulence., Disp: , Rfl:  .  sodium chloride (OCEAN) 0.65 % SOLN nasal spray, Place 1 spray into both nostrils 3 (three) times daily as needed for congestion., Disp: , Rfl:  .  tamsulosin (FLOMAX)  0.4 MG CAPS capsule, Take 0.4 mg by mouth at bedtime., Disp: , Rfl:  .  tiotropium (SPIRIVA) 18 MCG inhalation capsule, Place 2.5 mcg into inhaler and inhale daily. , Disp: , Rfl:  .  Tiotropium Bromide Monohydrate (SPIRIVA RESPIMAT) 2.5 MCG/ACT AERS, Inhale 2 puffs into the  lungs daily., Disp: , Rfl:  .  Zinc 25 MG TABS, Take 25 mg by mouth 4 (four) times a week. Sunday, Tuesday, Thursday, Saturday, Disp: , Rfl:  .  diclofenac sodium (VOLTAREN) 1 % GEL, Apply topically 4 (four) times daily., Disp: , Rfl:  .  traMADol (ULTRAM) 50 MG tablet, Take 50 mg by mouth every 6 (six) hours as needed., Disp: , Rfl:

## 2018-03-25 NOTE — Addendum Note (Signed)
Addended by: Demetrio Lapping E on: 03/25/2018 12:02 PM   Modules accepted: Orders

## 2018-03-25 NOTE — Addendum Note (Signed)
Addended by: Velvet Bathe on: 03/25/2018 11:57 AM   Modules accepted: Orders

## 2018-03-25 NOTE — Patient Instructions (Signed)
Severe COPD with recurrent exacerbations: Continue Brovana Continue Pulmicort Continue Spiriva Continue prednisone 5 mg daily Continue azithromycin as you are doing We will change the prescriptions for prednisone and azithromycin to 90-day prescriptions Stay active Practice good hand hygiene We will check a 12 lead EKG and liver function test today to make sure there is no toxicity from these medicines  Chronic respiratory failure with hypoxemia Continue using continuous O2 as you are doing  Depression: Keep follow-up with your primary care doctor regarding this  Follow-up 79-months

## 2018-03-25 NOTE — Addendum Note (Signed)
Addended by: Velvet Bathe on: 03/25/2018 11:44 AM   Modules accepted: Orders

## 2018-03-26 ENCOUNTER — Telehealth: Payer: Self-pay | Admitting: Pulmonary Disease

## 2018-03-26 NOTE — Progress Notes (Signed)
Pt notified of results/recs

## 2018-03-26 NOTE — Telephone Encounter (Signed)
Lupita LeashMcQuaid, Douglas B, MD sent to Velvet Batheaulfield, Ashley L, CMA        A,  Please let the patient know this was OK< K was a little low so he should eat a banana a day.  Thanks,  B    Spoke with pt and notified of results per Dr. Kendrick FriesMcQuaid. Pt verbalized understanding and denied any questions.

## 2018-04-03 MED ORDER — PREDNISONE 5 MG PO TABS: 5.0000 mg | ORAL_TABLET | Freq: Every day | ORAL | 5 refills | Status: DC

## 2018-04-10 ENCOUNTER — Telehealth: Payer: Self-pay | Admitting: *Deleted

## 2018-04-10 NOTE — Telephone Encounter (Signed)
Left message for patient to contact his PCP to have his heel pain evaluated. Informed him not to wait until he see's [place] in April. His appt with [place] 06/10/2018 is for AAA .

## 2018-04-11 ENCOUNTER — Other Ambulatory Visit: Payer: Self-pay | Admitting: Pulmonary Disease

## 2018-06-03 ENCOUNTER — Other Ambulatory Visit (HOSPITAL_COMMUNITY): Payer: No Typology Code available for payment source

## 2018-06-03 ENCOUNTER — Ambulatory Visit: Payer: Self-pay | Admitting: Vascular Surgery

## 2018-06-10 ENCOUNTER — Ambulatory Visit: Payer: Non-veteran care | Admitting: Vascular Surgery

## 2018-06-10 ENCOUNTER — Other Ambulatory Visit (HOSPITAL_COMMUNITY): Payer: Non-veteran care

## 2018-06-24 ENCOUNTER — Ambulatory Visit: Payer: No Typology Code available for payment source | Admitting: Pulmonary Disease

## 2018-08-11 ENCOUNTER — Ambulatory Visit (INDEPENDENT_AMBULATORY_CARE_PROVIDER_SITE_OTHER): Payer: No Typology Code available for payment source | Admitting: Primary Care

## 2018-08-11 ENCOUNTER — Encounter: Payer: Self-pay | Admitting: Primary Care

## 2018-08-11 ENCOUNTER — Other Ambulatory Visit: Payer: Self-pay

## 2018-08-11 DIAGNOSIS — J449 Chronic obstructive pulmonary disease, unspecified: Secondary | ICD-10-CM

## 2018-08-11 MED ORDER — PREDNISONE 10 MG PO TABS: ORAL_TABLET | ORAL | 0 refills | Status: DC

## 2018-08-11 NOTE — Progress Notes (Signed)
Virtual Visit via Telephone Note  I connected with Brian Clark on 08/11/18 at 10:00 AM EDT by telephone and verified that I am speaking with the correct person using two identifiers.  Location: Patient: Home Provider: Office   I discussed the limitations, risks, security and privacy concerns of performing an evaluation and management service by telephone and the availability of in person appointments. I also discussed with the patient that there may be a patient responsible charge related to this service. The patient expressed understanding and agreed to proceed.   History of Present Illness: 73 year old male, former smoker (quit in 2001). PMH significant for COPD, chronic bronchitis, pulmonary embolism/ DVT. Patient of Dr. Kendrick Fries, last seen on 03/25/18. Maintained on Brovana, Pulmicort, Spiriva, 5mg  prednisone and daily azithromycin.   08/11/2018 Patient called today for follow-up visit. States that he has been more short of breath with activity and has needed to increased oxygen to 4L. Experiences some wheezing. No significant cough. Several months ago went to PCP for exacerbation. Feels he never fully recovered. States that he hasn't been out of his house in years unless for doctors apts. He completed pulmonary rehab, not interested in returning.   Observations/Objective:  - Mild-mod dyspnea. No wheezing or cough observed during the phone call.  - Reports O2 95-98% 4L  ______________________________________________  PFT: November 2017 pulmonary function testing ratio 28%, FEV1 0.94 L 30% predicted, improvement of bronchodilator was 106 cc, 18%, total lung capacity 7.0 L 129% predicted, residual volume 213% predicted, DLCO 51% predicted 2015 FEV1 1.49 L 46% predicted  Assessment and Plan:  COPD - Worsening daily dyspnea symptoms - Last COPD exacerbation in February treated by PCP  - Continue Brovana, Pulmicort, Spiriva, 5mg  prednisone and daily azithromycin - RX prednisone taper  (40mg  x 3 days, 30mg  x 3 days, 20mg  x 3 days; 10mg  x 3 days) - Will discuss with Dr. Kendrick Fries about increasing daily prednisone to 7.5mg  or 10mg  daily   Follow Up Instructions:  3 months with Dr. Kendrick Fries or sooner if need   I discussed the assessment and treatment plan with the patient. The patient was provided an opportunity to ask questions and all were answered. The patient agreed with the plan and demonstrated an understanding of the instructions.   The patient was advised to call back or seek an in-person evaluation if the symptoms worsen or if the condition fails to improve as anticipated.  I provided 25 minutes of non-face-to-face time during this encounter.   Glenford Bayley, NP

## 2018-08-11 NOTE — Patient Instructions (Addendum)
Continue Brovana twice daily Continue Pulmicort twice daily Continue Spiriva daily  Continue 5mg  prednisone daily Continue azithromycin daily  RX: Prednisone taper (40mg  x 3 days, 30mg  x 3 days, 20mg  x 3 days; 10mg  x 3 days)  I will discuss with Dr. Kendrick Fries about potentially increasing daily prednisone   Stay as active as possible   Follow-up in 3 months with Dr. Kendrick Fries or sooner if needed

## 2018-08-11 NOTE — Progress Notes (Signed)
Reviewed, agree 

## 2018-08-12 ENCOUNTER — Telehealth: Payer: Self-pay | Admitting: Primary Care

## 2018-08-12 NOTE — Telephone Encounter (Signed)
Can you please let patient know I discussed with Dr. Kendrick Fries and he recommended patient complete prednisone taper only. No plans to increase daily prednisone - stay at 5mg  daily to resume after taper.

## 2018-08-12 NOTE — Telephone Encounter (Signed)
-----   Message from Lupita Leash, MD sent at 08/11/2018  6:49 PM EDT ----- Regarding: RE: ? increase daily pred dose Start with taper ----- Message ----- From: Glenford Bayley, NP Sent: 08/11/2018  12:08 PM EDT To: Lupita Leash, MD Subject: ? increase daily pred dose                     Patient having more sob and wheezing. No significant cough. Was treated for exacerbation by PCP in feb since his last visit with you. He is not very active, does not leave his house unless going to doctors apt. I am giving him a prednisone taper, do you want me to increase his daily prednisone from 5mg  to 10mg  daily or just do the taper???  -beth

## 2018-08-12 NOTE — Telephone Encounter (Signed)
Spoke with wife Steward Drone [facility]) and relayed Dr Ulyses Jarred recommendations.  Nothing further is needed.

## 2018-08-19 ENCOUNTER — Ambulatory Visit: Payer: Self-pay | Admitting: Vascular Surgery

## 2018-08-19 ENCOUNTER — Other Ambulatory Visit (HOSPITAL_COMMUNITY): Payer: No Typology Code available for payment source

## 2019-07-07 ENCOUNTER — Other Ambulatory Visit: Payer: Self-pay | Admitting: *Deleted

## 2019-07-07 DIAGNOSIS — I714 Abdominal aortic aneurysm, without rupture, unspecified: Secondary | ICD-10-CM

## 2019-07-13 ENCOUNTER — Telehealth (HOSPITAL_COMMUNITY): Payer: Self-pay

## 2019-07-13 NOTE — Telephone Encounter (Signed)

## 2019-07-14 ENCOUNTER — Encounter: Payer: Self-pay | Admitting: Vascular Surgery

## 2019-07-14 ENCOUNTER — Ambulatory Visit (HOSPITAL_COMMUNITY)
Admission: RE | Admit: 2019-07-14 | Discharge: 2019-07-14 | Disposition: A | Discharge status: Home / Self Care | Payer: No Typology Code available for payment source | Source: Ambulatory Visit | Attending: Vascular Surgery | Admitting: Vascular Surgery

## 2019-07-14 ENCOUNTER — Other Ambulatory Visit: Payer: Self-pay

## 2019-07-14 ENCOUNTER — Ambulatory Visit (INDEPENDENT_AMBULATORY_CARE_PROVIDER_SITE_OTHER): Payer: Medicare PPO | Admitting: Vascular Surgery

## 2019-07-14 VITALS — BP 136/83 | HR 82 | Temp 98.0°F | Resp 20 | Ht 72.0 in | Wt 180.0 lb

## 2019-07-14 DIAGNOSIS — I714 Abdominal aortic aneurysm, without rupture, unspecified: Secondary | ICD-10-CM

## 2019-07-14 NOTE — Progress Notes (Signed)
Patient name: Brian Clark MRN: [id number] DOB: August 12, 1945 Sex: male  REASON FOR VISIT:   Follow-up of abdominal aortic aneurysm.  HPI:   Brian Clark is a pleasant 74 y.o. male who I see in consultation with a 4.9 cm infrarenal abdominal aortic aneurysm in October 2019.  I felt that he would be at increased risk for any surgery given his severe COPD.  I explained that in a normal risk patient we would consider elective repair at 5.5 cm.  I think since that time he was followed by the nurse practitioner.  On my history the patient denies any abdominal pain or back pain.  He is on continuous home O2 and his activity is very limited.  He is pretty much limited to the wheelchair.  I do not get any history of rest pain.  He is on more than 20 medications.  Past Medical History:  Diagnosis Date  . Anemia   . Arthritis    "numerous joints" (06/20/2014)  . Asthma   . Chronic back pain    "w/standing; not as bad when I'm walking"  . Chronic bronchitis (HCC)    "I keep it"  . COPD (chronic obstructive pulmonary disease) (HCC)   . Cyst of right kidney   . DVT (deep venous thrombosis) (HCC) 06/19/2014   right popliteal vein Hattie Perch 06/20/2014  . Hepatitis 1962   "don't know which one"  . History of blood transfusion "I've had many"   "related to ORs"  . History of duodenal ulcer   . History of stomach ulcers   . Hypertension   . On home oxygen therapy    "3L when I sleep; 4L when I'm active" (06/20/2014)  . Positive TB test   . Pulmonary embolism (HCC)    hospitalized 06/19/2014  . Spondylosis    "C1-L5" (06/20/2014)    History reviewed. No pertinent family history.  SOCIAL HISTORY: Social History   Tobacco Use  . Smoking status: Former Smoker    Packs/day: 2.00    Years: 47.00    Pack years: 94.00    Types: Cigarettes    Quit date: 08/06/1999    Years since quitting: 19.9  . Smokeless tobacco: Former Neurosurgeon    Types: Chew    Quit date: 08/06/1999  . Tobacco comment:  06/20/2014 "chew nicotine gum"  Substance Use Topics  . Alcohol use: Yes    Alcohol/week: 42.0 standard drinks    Types: 42 Cans of beer per week    Comment: 06/20/2014 "4-6 beers/day; drink more when I'm watching ballgame"    Allergies  Allergen Reactions  . Ciprofloxacin Other (See Comments)    CAUSED BLOOD CLOTS.   Candyce Churn Flavor Other (See Comments)    SHOWED ON ALLERGY TESTING.   . Molds & Smuts     Other reaction(s): Other (See Comments) SHOWED ON ALLERGY TESTING.   . Other Other (See Comments)    Allergic to dogs, cats, maple trees, elm trees, black mold per allergy test  . Tetanus Toxoids Swelling and Rash    Current Outpatient Medications  Medication Sig Dispense Refill  . albuterol (PROVENTIL HFA;VENTOLIN HFA) 108 (90 BASE) MCG/ACT inhaler Inhale 2 puffs into the lungs 4 (four) times daily as needed for wheezing or shortness of breath.     Marland Kitchen albuterol (PROVENTIL) (2.5 MG/3ML) 0.083% nebulizer solution Take 2.5 mg by nebulization every 6 (six) hours as needed for wheezing or shortness of breath.    . ALPRAZolam (XANAX) 0.5 MG tablet Take  0.5 mg by mouth daily as needed for anxiety.    Marland Kitchen apixaban (ELIQUIS) 5 MG TABS tablet Take 5 mg by mouth 2 (two) times daily.    Marland Kitchen arformoterol (BROVANA) 15 MCG/2ML NEBU Take 15 mcg by nebulization 2 (two) times daily.    Marland Kitchen aspirin EC 81 MG tablet Take 81 mg by mouth daily.    Marland Kitchen azithromycin (ZITHROMAX) 250 MG tablet Take 250 mg by mouth daily.     . benzonatate (TESSALON) 100 MG capsule Take 100 mg by mouth 3 (three) times daily.     . budesonide (PULMICORT) 0.5 MG/2ML nebulizer solution Take 0.5 mg by nebulization 2 (two) times daily.    . budesonide-formoterol (SYMBICORT) 80-4.5 MCG/ACT inhaler Inhale 2 puffs into the lungs 2 (two) times daily. 1 Inhaler 0  . Carboxymethylcellulose Sodium 0.25 % SOLN Apply to eye.    . cetirizine (ZYRTEC) 10 MG tablet Take 10 mg by mouth daily as needed.    . Cholecalciferol (VITAMIN D3) 2000 UNITS TABS  Take 2,000 Units by mouth daily.    . cycloSPORINE (RESTASIS) 0.05 % ophthalmic emulsion 1 drop 2 (two) times daily.    . diclofenac sodium (VOLTAREN) 1 % GEL Apply topically 4 (four) times daily.    . finasteride (PROSCAR) 5 MG tablet Take 5 mg by mouth at bedtime.    . furosemide (LASIX) 40 MG tablet Take 1 tablet (40 mg total) by mouth daily as needed for up to 15 doses (ankle swelling). (Patient taking differently: Take 40 mg by mouth daily. ) 15 tablet 1  . guaifenesin (HUMIBID E) 400 MG TABS tablet Take 1,200 mg by mouth 2 (two) times daily.    Marland Kitchen ketotifen (ZADITOR) 0.025 % ophthalmic solution Place 2 drops into both eyes 2 (two) times daily.    Marland Kitchen losartan (COZAAR) 50 MG tablet Take 50 mg by mouth daily.    . mirtazapine (REMERON) 15 MG tablet     . Misc Natural Products (TURMERIC CURCUMIN) CAPS Take 1 capsule by mouth 3 (three) times a week. Monday, Wednesday, Friday (750 mg each)    . montelukast (SINGULAIR) 10 MG tablet Take 10 mg by mouth daily.    . Multiple Vitamin (MULTIVITAMIN WITH MINERALS) TABS tablet Take 1 tablet by mouth daily. Senior Vitamin for Men    . Multiple Vitamins-Minerals (PRESERVISION AREDS 2 PO) Take by mouth.    . nicotine polacrilex (NICORETTE) 4 MG gum Take 4 mg by mouth every hour as needed for smoking cessation.    . Omega-3 Krill Oil 500 MG CAPS Take 500 mg by mouth 4 (four) times a week. Sunday, Tuesday, Thursday, Saturday    . omeprazole (PRILOSEC) 20 MG capsule Take 20 mg by mouth 2 (two) times daily before a meal.     . oxybutynin (DITROPAN-XL) 10 MG 24 hr tablet Take 10 mg by mouth at bedtime.    . predniSONE (DELTASONE) 10 MG tablet Take 4 tabs po daily x 3 days; then 3 tabs daily x3 days; then 2 tabs daily x3 days; then 1 tab daily x 3 days; then stop 30 tablet 0  . predniSONE (DELTASONE) 5 MG tablet Take 1 tablet (5 mg total) by mouth daily with breakfast. 30 tablet 5  . sildenafil (VIAGRA) 100 MG tablet Take 50 mg by mouth daily as needed for erectile  dysfunction.    . simethicone (MYLICON) 80 MG chewable tablet Chew 80 mg by mouth every 6 (six) hours as needed for flatulence.    Marland Kitchen  sodium chloride (OCEAN) 0.65 % SOLN nasal spray Place 1 spray into both nostrils 3 (three) times daily as needed for congestion.    . tamsulosin (FLOMAX) 0.4 MG CAPS capsule Take 0.4 mg by mouth at bedtime.    Marland Kitchen tiotropium (SPIRIVA) 18 MCG inhalation capsule Place 2.5 mcg into inhaler and inhale daily.     . Tiotropium Bromide Monohydrate (SPIRIVA RESPIMAT) 2.5 MCG/ACT AERS Inhale 2 puffs into the lungs daily.    . traMADol (ULTRAM) 50 MG tablet Take 50 mg by mouth every 6 (six) hours as needed.    . traZODone (DESYREL) 50 MG tablet     . Zinc 25 MG TABS Take 25 mg by mouth 4 (four) times a week. Sunday, Tuesday, Thursday, Saturday     No current facility-administered medications for this visit.    REVIEW OF SYSTEMS:  [X]  denotes positive finding, [ ]  denotes negative finding Cardiac  Comments:  Chest pain or chest pressure:    Shortness of breath upon exertion:    Short of breath when lying flat:    Irregular heart rhythm:        Vascular    Pain in calf, thigh, or hip brought on by ambulation:    Pain in feet at night that wakes you up from your sleep:     Blood clot in your veins:    Leg swelling:         Pulmonary    Oxygen at home: x   Productive cough:     Wheezing:  x       Neurologic    Sudden weakness in arms or legs:     Sudden numbness in arms or legs:     Sudden onset of difficulty speaking or slurred speech:    Temporary loss of vision in one eye:     Problems with dizziness:         Gastrointestinal    Blood in stool:     Vomited blood:         Genitourinary    Burning when urinating:     Blood in urine:        Psychiatric    Major depression:         Hematologic    Bleeding problems:    Problems with blood clotting too easily:        Skin    Rashes or ulcers:        Constitutional    Fever or chills:      PHYSICAL EXAM:   Vitals:   07/14/19 0943  BP: 136/83  Pulse: 82  Resp: 20  Temp: 98 F (36.7 C)  SpO2: 97%  Weight: 180 lb (81.6 kg)  Height: 6' (1.829 m)    GENERAL: The patient is a well-nourished male, in no acute distress. The vital signs are documented above. CARDIAC: There is a regular rate and rhythm.  VASCULAR: I do not detect carotid bruits. It was difficult to examine him because he is in a wheelchair but I could feel a right femoral pulse.  I had a difficult time feeling a left femoral pulse. I cannot palpate pedal pulses however both feet are warm and well perfused. PULMONARY: There is good air exchange bilaterally without wheezing or rales. ABDOMEN: Soft and non-tender with normal pitched bowel sounds.  It is difficult to palpate his aneurysm in the wheelchair. MUSCULOSKELETAL: There are no major deformities or cyanosis. NEUROLOGIC: No focal weakness or paresthesias are detected. SKIN: There are  no ulcers or rashes noted. PSYCHIATRIC: The patient has a normal affect.  DATA:    DUPLEX ABDOMINAL AORTA: I have independently interpreted his duplex of the abdominal aorta.  This shows that the maximum diameter is 5.0 cm.  CT ABDOMEN PELVIS: I reviewed his CT abdomen and pelvis that was done elsewhere.  This was done without contrast.  The radiologist interpreted this as 5.3 cm in maximum diameter.  The largest diameter I could obtain was 5.1.  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  MEDICAL ISSUES:   5.1 CM INFRARENAL ABDOMINAL AORTIC ANEURYSM: This aneurysm has enlarged from 4.9 to 5.1 cm in 2 years.  I explained that in a normal risk patient we would consider elective repair at 5.5 cm.  Given his debilitated state on continuous oxygen with severe COPD he would obviously be at high risk for surgery.  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  Thus he  would require open repair which would be associated with very high risk.  I have ordered a follow-up ultrasound of his aneurysm in 6 months.  If the aneurysm enlarges significantly and we were going to have to consider elective repair and he would need a CT angiogram with contrast.  Last labs I show that his renal function was normal.  I will plan on seeing him back in 6 months.  He knows to call sooner if he has problems.  Waverly Ferrari [facility] [phone]

## 2019-07-16 ENCOUNTER — Other Ambulatory Visit: Payer: Self-pay | Admitting: *Deleted

## 2019-07-16 DIAGNOSIS — I714 Abdominal aortic aneurysm, without rupture, unspecified: Secondary | ICD-10-CM

## 2019-12-20 ENCOUNTER — Other Ambulatory Visit: Payer: Self-pay | Admitting: Pulmonary Disease

## 2021-05-30 DIAGNOSIS — I208 Other forms of angina pectoris: Secondary | ICD-10-CM | POA: Diagnosis not present

## 2021-05-30 DIAGNOSIS — K219 Gastro-esophageal reflux disease without esophagitis: Secondary | ICD-10-CM | POA: Diagnosis not present

## 2021-05-30 DIAGNOSIS — I10 Essential (primary) hypertension: Secondary | ICD-10-CM | POA: Diagnosis not present

## 2021-05-30 DIAGNOSIS — F419 Anxiety disorder, unspecified: Secondary | ICD-10-CM | POA: Diagnosis not present

## 2021-05-30 DIAGNOSIS — Z0000 Encounter for general adult medical examination without abnormal findings: Secondary | ICD-10-CM | POA: Diagnosis not present

## 2021-05-30 DIAGNOSIS — J449 Chronic obstructive pulmonary disease, unspecified: Secondary | ICD-10-CM | POA: Diagnosis not present

## 2021-08-28 DIAGNOSIS — H353132 Nonexudative age-related macular degeneration, bilateral, intermediate dry stage: Secondary | ICD-10-CM | POA: Diagnosis not present

## 2021-08-28 DIAGNOSIS — Z961 Presence of intraocular lens: Secondary | ICD-10-CM | POA: Diagnosis not present

## 2021-08-28 DIAGNOSIS — H524 Presbyopia: Secondary | ICD-10-CM | POA: Diagnosis not present

## 2022-06-18 DIAGNOSIS — Z9181 History of falling: Secondary | ICD-10-CM | POA: Diagnosis not present

## 2022-06-18 DIAGNOSIS — I714 Abdominal aortic aneurysm, without rupture, unspecified: Secondary | ICD-10-CM | POA: Diagnosis not present

## 2022-06-18 DIAGNOSIS — J449 Chronic obstructive pulmonary disease, unspecified: Secondary | ICD-10-CM | POA: Diagnosis not present

## 2022-06-18 DIAGNOSIS — Z1331 Encounter for screening for depression: Secondary | ICD-10-CM | POA: Diagnosis not present

## 2022-06-18 DIAGNOSIS — K219 Gastro-esophageal reflux disease without esophagitis: Secondary | ICD-10-CM | POA: Diagnosis not present

## 2022-06-18 DIAGNOSIS — Z0000 Encounter for general adult medical examination without abnormal findings: Secondary | ICD-10-CM | POA: Diagnosis not present

## 2023-07-21 DIAGNOSIS — I10 Essential (primary) hypertension: Secondary | ICD-10-CM | POA: Diagnosis not present

## 2023-07-21 DIAGNOSIS — R1084 Generalized abdominal pain: Secondary | ICD-10-CM | POA: Diagnosis not present

## 2023-07-21 DIAGNOSIS — R062 Wheezing: Secondary | ICD-10-CM | POA: Diagnosis not present

## 2023-07-24 DIAGNOSIS — R404 Transient alteration of awareness: Secondary | ICD-10-CM | POA: Diagnosis not present

## 2023-07-24 DIAGNOSIS — Z743 Need for continuous supervision: Secondary | ICD-10-CM | POA: Diagnosis not present

## 2023-08-10 DEATH — deceased
